# Patient Record
Sex: Female | Born: 1980 | Race: White | Hispanic: No | Marital: Married | Smoking: Current some day smoker
Health system: Southern US, Community
[De-identification: ages and names within clinical notes are randomized; demographics above are authoritative.]

## PROBLEM LIST (undated history)

## (undated) DIAGNOSIS — M1990 Unspecified osteoarthritis, unspecified site: Secondary | ICD-10-CM

## (undated) DIAGNOSIS — J45909 Unspecified asthma, uncomplicated: Secondary | ICD-10-CM

## (undated) HISTORY — PX: APPENDECTOMY: SHX54

## (undated) HISTORY — PX: KNEE ARTHROSCOPY: SUR90

---

## 2004-06-15 ENCOUNTER — Emergency Department: Payer: Self-pay | Admitting: Emergency Medicine

## 2004-09-17 ENCOUNTER — Emergency Department: Payer: Self-pay | Admitting: Emergency Medicine

## 2005-01-22 ENCOUNTER — Emergency Department: Payer: Self-pay | Admitting: Emergency Medicine

## 2005-03-17 ENCOUNTER — Emergency Department: Payer: Self-pay | Admitting: Emergency Medicine

## 2005-06-10 ENCOUNTER — Emergency Department: Payer: Self-pay | Admitting: Emergency Medicine

## 2007-09-02 ENCOUNTER — Observation Stay: Payer: Self-pay | Admitting: Unknown Physician Specialty

## 2008-01-12 ENCOUNTER — Observation Stay: Payer: Self-pay | Admitting: Unknown Physician Specialty

## 2008-02-12 ENCOUNTER — Observation Stay: Payer: Self-pay

## 2008-03-06 ENCOUNTER — Observation Stay: Payer: Self-pay | Admitting: Unknown Physician Specialty

## 2008-03-16 ENCOUNTER — Ambulatory Visit: Payer: Self-pay

## 2008-03-19 ENCOUNTER — Ambulatory Visit: Payer: Self-pay

## 2008-03-23 ENCOUNTER — Observation Stay: Payer: Self-pay

## 2008-03-28 ENCOUNTER — Ambulatory Visit: Payer: Self-pay

## 2008-03-29 ENCOUNTER — Inpatient Hospital Stay: Payer: Self-pay

## 2008-10-29 ENCOUNTER — Ambulatory Visit: Payer: Self-pay | Admitting: Family Medicine

## 2008-11-14 ENCOUNTER — Emergency Department: Payer: Self-pay | Admitting: Emergency Medicine

## 2008-12-11 ENCOUNTER — Ambulatory Visit: Payer: Self-pay | Admitting: Unknown Physician Specialty

## 2009-02-06 ENCOUNTER — Ambulatory Visit: Payer: Self-pay | Admitting: Family Medicine

## 2009-04-19 ENCOUNTER — Ambulatory Visit: Payer: Self-pay | Admitting: Family Medicine

## 2009-05-14 ENCOUNTER — Emergency Department: Payer: Self-pay | Admitting: Emergency Medicine

## 2009-09-05 ENCOUNTER — Ambulatory Visit: Payer: Self-pay

## 2009-09-18 ENCOUNTER — Ambulatory Visit: Payer: Self-pay | Admitting: Orthopedic Surgery

## 2009-09-19 ENCOUNTER — Ambulatory Visit: Payer: Self-pay | Admitting: Orthopedic Surgery

## 2014-04-27 ENCOUNTER — Ambulatory Visit: Payer: Self-pay | Admitting: Family Medicine

## 2014-05-27 ENCOUNTER — Observation Stay: Payer: Self-pay | Admitting: Surgery

## 2014-05-27 LAB — URINALYSIS, COMPLETE
BACTERIA: NONE SEEN
Bilirubin,UR: NEGATIVE
Blood: NEGATIVE
Glucose,UR: NEGATIVE mg/dL (ref 0–75)
Ketone: NEGATIVE
Leukocyte Esterase: NEGATIVE
Nitrite: NEGATIVE
Ph: 5 (ref 4.5–8.0)
Protein: NEGATIVE
RBC,UR: 1 /HPF (ref 0–5)
Specific Gravity: 1.011 (ref 1.003–1.030)
Squamous Epithelial: 12
WBC UR: 1 /HPF (ref 0–5)

## 2014-05-27 LAB — CBC WITH DIFFERENTIAL/PLATELET
BASOS PCT: 0.3 %
Basophil #: 0 10*3/uL (ref 0.0–0.1)
EOS ABS: 0 10*3/uL (ref 0.0–0.7)
EOS PCT: 0 %
HCT: 40.3 % (ref 35.0–47.0)
HGB: 13 g/dL (ref 12.0–16.0)
LYMPHS PCT: 10.3 %
Lymphocyte #: 1 10*3/uL (ref 1.0–3.6)
MCH: 30 pg (ref 26.0–34.0)
MCHC: 32.2 g/dL (ref 32.0–36.0)
MCV: 93 fL (ref 80–100)
Monocyte #: 0.4 x10 3/mm (ref 0.2–0.9)
Monocyte %: 4.4 %
NEUTROS ABS: 8.2 10*3/uL — AB (ref 1.4–6.5)
Neutrophil %: 85 %
Platelet: 219 10*3/uL (ref 150–440)
RBC: 4.32 10*6/uL (ref 3.80–5.20)
RDW: 13.8 % (ref 11.5–14.5)
WBC: 9.7 10*3/uL (ref 3.6–11.0)

## 2014-05-27 LAB — COMPREHENSIVE METABOLIC PANEL
ALK PHOS: 94 U/L
ALT: 29 U/L
ANION GAP: 5 — AB (ref 7–16)
Albumin: 3.8 g/dL (ref 3.4–5.0)
BUN: 11 mg/dL (ref 7–18)
Bilirubin,Total: 0.9 mg/dL (ref 0.2–1.0)
CO2: 25 mmol/L (ref 21–32)
Calcium, Total: 8.5 mg/dL (ref 8.5–10.1)
Chloride: 109 mmol/L — ABNORMAL HIGH (ref 98–107)
Creatinine: 0.83 mg/dL (ref 0.60–1.30)
Glucose: 121 mg/dL — ABNORMAL HIGH (ref 65–99)
OSMOLALITY: 278 (ref 275–301)
Potassium: 4.3 mmol/L (ref 3.5–5.1)
SGOT(AST): 14 U/L — ABNORMAL LOW (ref 15–37)
SODIUM: 139 mmol/L (ref 136–145)
Total Protein: 7.1 g/dL (ref 6.4–8.2)

## 2014-05-27 LAB — LIPASE, BLOOD: Lipase: 78 U/L (ref 73–393)

## 2014-10-04 ENCOUNTER — Ambulatory Visit
Admit: 2014-10-04 | Disposition: A | Discharge status: Home / Self Care | Payer: Self-pay | Attending: Family Medicine | Admitting: Family Medicine

## 2014-10-27 NOTE — H&P (Signed)
Subjective/Chief Complaint abd pain   History of Present Illness started diffuse, now max in RLQ nausea, no emesis, started last nigth worsening no prior episode no f/c. nml BM   Past History RA PSH c section x 2   Past Medical Health Smoking   Past Med/Surgical Hx:  Asthma:   JAW SURGERY TO CORRECT OVERBITE:   C-Section:   ALLERGIES:  Tylenol w/Codeine: Itching  Vicodin: Itching  Family and Social History:  Family History Non-Contributory   Social History positive tobacco (Greater than 1 year), negative ETOH, peds office/front   + Tobacco Current (within 1 year)   Review of Systems:  Fever/Chills No   Cough No   Abdominal Pain Yes   Diarrhea No   Constipation No   Nausea/Vomiting Yes   SOB/DOE No   Dysuria No   Tolerating Diet No  Nauseated   Medications/Allergies Reviewed Medications/Allergies reviewed   Physical Exam:  GEN obese, uncomfortable   HEENT pink conjunctivae   NECK supple   RESP normal resp effort  clear BS   CARD regular rate   ABD positive tenderness  normal BS  pos Rovsing's sign, guarding RLQ   LYMPH negative neck   EXTR negative edema   SKIN No rashes, tattoos   PSYCH alert, A+O to time, place, person   Lab Results: Hepatic:  22-Nov-15 10:00   Bilirubin, Total 0.9  Alkaline Phosphatase 94 (46-116 NOTE: New Reference Range 01/23/14)  SGPT (ALT) 29 (14-63 NOTE: New Reference Range 01/23/14)  SGOT (AST)  14  Total Protein, Serum 7.1  Albumin, Serum 3.8  Routine Chem:  22-Nov-15 10:00   Lipase 78 (Result(s) reported on 27 May 2014 at 10:53AM.)  Glucose, Serum  121  BUN 11  Creatinine (comp) 0.83  Sodium, Serum 139  Potassium, Serum 4.3  Chloride, Serum  109  CO2, Serum 25  Calcium (Total), Serum 8.5  Osmolality (calc) 278  eGFR (African American) >60  eGFR (Non-African American) >60 (eGFR values <67m/min/1.73 m2 may be an indication of chronic kidney disease (CKD). Calculated eGFR, using the MRDR  Study equation, is useful in  patients with stable renal function. The eGFR calculation will not be reliable in acutely ill patients when serum creatinine is changing rapidly. It is not useful in patients on dialysis. The eGFR calculation may not be applicable to patients at the low and high extremes of body sizes, pregnant women, and vegetarians.)  Anion Gap  5  Routine UA:  22-Nov-15 10:00   Color (UA) Yellow  Clarity (UA) Hazy  Glucose (UA) Negative  Bilirubin (UA) Negative  Ketones (UA) Negative  Specific Gravity (UA) 1.011  Blood (UA) Negative  pH (UA) 5.0  Protein (UA) Negative  Nitrite (UA) Negative  Leukocyte Esterase (UA) Negative (Result(s) reported on 27 May 2014 at 10:46AM.)  RBC (UA) 1 /HPF  WBC (UA) 1 /HPF  Bacteria (UA) NONE SEEN  Epithelial Cells (UA) 12 /HPF  Mucous (UA) PRESENT (Result(s) reported on 27 May 2014 at 10:46AM.)  Routine Hem:  22-Nov-15 10:00   WBC (CBC) 9.7  RBC (CBC) 4.32  Hemoglobin (CBC) 13.0  Hematocrit (CBC) 40.3  Platelet Count (CBC) 219  MCV 93  MCH 30.0  MCHC 32.2  RDW 13.8  Neutrophil % 85.0  Lymphocyte % 10.3  Monocyte % 4.4  Eosinophil % 0.0  Basophil % 0.3  Neutrophil #  8.2  Lymphocyte # 1.0  Monocyte # 0.4  Eosinophil # 0.0  Basophil # 0.0 (Result(s) reported on 27 May 2014 at 10:43AM.)   Radiology Results: [place]:    22-Nov-15 11:22, US Pelvis Ultrasound Exam with Transvaginal - NON-OB  US Pelvis Ultrasound Exam with Transvaginal - NON-OB  REASON FOR EXAM:    rlq pain  COMMENTS:       PROCEDURE: [place]  - US PELVIS EXAM W/TRANSVAGINAL  - May 27 2014 11:22AM     CLINICAL DATA:  34 year old female with right pelvic pain for 12 hr.    EXAM:  TRANSABDOMINAL AND TRANSVAGINAL ULTRASOUND OF PELVIS    TECHNIQUE:  Both transabdominal and transvaginal ultrasound examinations of the  pelvis were performed. Transabdominal technique was performed for  global imaging of the pelvis including uterus, ovaries, adnexal  regions,  and pelvic cul-de-sac. It was necessary to proceed with  endovaginal exam following the transabdominal exam to visualize the  ovaries and endometrium.    COMPARISON:  None    FINDINGS:  Uterus    Measurements: Anteverted measuring 8.5 x 3.8 x 5.3 cm. No fibroids  or other mass visualized.    Endometrium    Thickness: Homogeneous measuring 6.8 mm. No focal abnormality  visualized.  Right ovary    Measurements: 2.9 x 2.7 x 3.4 cm. A 1.5 x 2 cm cyst/ follicle is  noted.. Normal appearance/no adnexal mass.    Left ovary    Measurements: 2.6 x 1.2 x 2.6 cm.. Normal appearance/no adnexal  mass.    Other findings    A trace amount of free fluid may be physiologic. There is no  evidence of adnexal mass.   IMPRESSION:  Trace amount of free pelvic fluid which may be physiologic.  Otherwise unremarkable pelvic ultrasound.      Electronically Signed    By: Hassan Rowan M.D.    On: 05/27/2014 11:54         Verified By: Lura Em, M.D.,  CT:    22-Nov-15 11:58, CT Abdomen and Pelvis With Contrast  CT Abdomen and Pelvis With Contrast  REASON FOR EXAM:    (1) rlq and ruq pain; (2) rlq and ruq pain  COMMENTS:       PROCEDURE: CT  - CT ABDOMEN / PELVIS  W  - May 27 2014 11:58AM     CLINICAL DATA:  Generalized abdominal pain beginning last night.  Worse today. Nausea.    EXAM:  CT ABDOMEN AND PELVIS WITH CONTRAST    TECHNIQUE:  Multidetector CT imaging of the abdomen and pelvis was performed  using the standard protocol following bolus administration of  intravenous contrast.  CONTRAST:  125 cc Isovue 370.    COMPARISON:  Pelvic ultrasound 05/27/2014.    FINDINGS:  Lung bases are clear.  No effusions.  Heart is normal size.    Multiple gallstones noted within the gallbladder. No biliary ductal  dilatation. No visible ductal stones. Liver, spleen, pancreas,  adrenals and kidneys are normal.    Stomach, large and small bowel are unremarkable. Trace free fluid  in  the pelvis, likely physiologic. Uterus and adnexa unremarkable.  Small collapsing corpus luteum cyst in the right ovary. Aorta is  normal caliber.  There is an appendicolith with in the mid portion of the appendix,  best seen on coronal image 72. The distal appendix beyond the  appendicolith is dilated with slight inflammatory change compatible  with mid to distal appendicitis.    No acute bony abnormality or focal bone lesion. Bilateral L5 pars  defects present. No malalignment.     IMPRESSION:  Appendicolith  in the mid appendix with dilated appendix distally  compatible with mid to distal appendicitis.      Electronically Signed    By: Rolm Baptise M.D.  On: 05/27/2014 12:17         Verified By: Raelyn Number, M.D.,    Assessment/Admission Diagnosis ac appendicitis rec lap appy risks options agrees   Electronic Signatures: Florene Glen (MD)  (Signed [phone] 13:24)  Authored: CHIEF COMPLAINT and HISTORY, PAST MEDICAL/SURGIAL HISTORY, ALLERGIES, FAMILY AND SOCIAL HISTORY, REVIEW OF SYSTEMS, PHYSICAL EXAM, LABS, Radiology, ASSESSMENT AND PLAN   Last Updated: 22-Nov-15 13:24 by Florene Glen (MD)

## 2014-10-27 NOTE — H&P (Signed)
PATIENT NAME:  Allison Moss, Briar S MR#:  [id number] DATE OF BIRTH:  September 22, 1980  DATE OF ADMISSION:  05/27/2014  CHIEF COMPLAINT: Abdominal pain.   HISTORY OF PRESENT ILLNESS: This is a patient with right lower quadrant pain that started periumbilical and somewhat diffusely and is now centered in the right lower quadrant. It has been worsening. It started last night. She has never had an episode like this before. Denies melena, hematochezia. She had a normal bowel movement without diarrhea today. No fevers or chills. She is nauseated, but has not vomited. A work up in the emergency room suggested acute appendicitis and I was asked to see the patient for this.   PAST MEDICAL HISTORY: Rheumatoid arthritis.   PAST SURGICAL HISTORY: C-section x 2.   ALLERGIES: None.   FAMILY HISTORY: Noncontributory.   SOCIAL HISTORY: The patient smokes tobacco. Does not drink alcohol. 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   REVIEW OF SYSTEMS: A 10 system review is performed and negative with the exception of that mentioned in the HPI.   PHYSICAL EXAMINATION: GENERAL: Uncomfortable -appearing, obese, female patient. BMI is 38.   VITAL SIGNS: Temperature of 98, pulse of 89, respirations 18, blood pressure 156/72. Pain scale at 9 and 96% room air saturation.   HEENT: No scleral icterus.   NECK: No palpable neck nodes.   CHEST: Clear to auscultation.   CARDIAC: Regular rate and rhythm.   ABDOMEN: Showing some guarding in the right lower quadrant with maximum tenderness at McBurney's point with a positive Rovsing's sign.   EXTREMITIES: Without edema.   NEUROLOGIC: Grossly intact.   INTEGUMENT: No jaundice. Tattoos are noted.   LABORATORY DATA: White blood cell count is normal at 9.7, hemoglobin and hematocrit of 13 and 40 with a platelet count of 219,000. Electrolytes are within normal limits.   DIAGNOSTIC DATA: CT scan is personally reviewed,  suggestive of early appendicitis.   ASSESSMENT  AND PLAN: This is a patient with acute appendicitis. Preoperatively, we discussed the rationale for surgery, the options of observation, risk of bleeding, infection, recurrence, negative laparoscopy and conversion to an open procedure. This was reviewed for her and her family member. They understood and agreed to proceed.     ____________________________ Adah Salvageichard E. Excell Seltzerooper, MD rec:TT D: 05/27/2014 13:27:55 ET T: 05/27/2014 14:24:27 ET JOB#: [id number]  cc: Adah Salvageichard E. Excell Seltzerooper, MD, <Dictator> Lattie HawICHARD E Kallin Henk MD ELECTRONICALLY SIGNED 05/27/2014 16:10

## 2014-10-27 NOTE — Op Note (Signed)
PATIENT NAME:  Allison Moss, Jrue S MR#:  [id number] DATE OF BIRTH:  March 08, 1981  DATE OF PROCEDURE:  05/27/2014  PREOPERATIVE DIAGNOSIS: Acute appendicitis.   POSTOPERATIVE DIAGNOSIS: Acute appendicitis.   PROCEDURE: Laparoscopic appendectomy.   SURGEON: Jayro Mcmath E. Excell Seltzerooper, MD   ANESTHESIA: General with endotracheal tube.   INDICATIONS: This is a patient with progressive right lower quadrant pain and tenderness with signs of peritoneal inflammation; a CT scan suggestive of acute appendicitis. Preoperatively, we discussed rationale for surgery, the options of observation, risks of bleeding, infection, recurrence, negative laparoscopy, and conversion to an open procedure. This was all reviewed for sure her and her family in the Emergency Room. They understood and agreed to proceed.   FINDINGS: Acute appendicitis, nonruptured.   DESCRIPTION OF PROCEDURE: The patient was induced to general anesthesia. She was given IV antibiotics. VTE prophylaxis was in place. She was prepped and draped in a sterile fashion. A Foley catheter had been placed as well. Local anesthetic was infiltrated in the skin and subcutaneous tissues around the infraumbilical area. An incision was made. Veress needle was placed. Pneumoperitoneum was obtained. A 5 mm trocar port was placed. The abdominal cavity was explored and under direct vision, a 13 mm left lateral port was placed followed by a 5 mm suprapubic port. There were no adhesions from her prior C-sections noted.  The appendix was identified in the right lower quadrant and elevated. The base of the appendix was divided with a standard load Endo GIA, and then a vascular load Endo GIA was fired across the mesoappendix. The specimen was passed out through the lateral port site with the aid of an Endo Catch bag. The area was checked for hemostasis. The staple line had an arterial bleeder on it, which was handled with clips adequately controlling the hemorrhage, which was minimal.  Again, the area was irrigated with copious amounts of normal saline. No further bleeding was noted. The left lateral port site was closed under direct vision with multiple simple sutures of 0 Vicryl utilizing an Endo Close technique, then again hemostasis found to be adequate. Pneumoperitoneum was released. All ports were removed. A 4-0 subcuticular Monocryl was used on all skin edges. Steri-Strips, Mastisol, and sterile dressings were placed.  The patient tolerated the procedure well. There were no complications. She was taken to the recovery room in stable condition to be admitted for continued care.    ____________________________ Adah Salvageichard E. Excell Seltzerooper, MD rec:sw D: 05/27/2014 15:29:30 ET T: 05/27/2014 18:50:04 ET JOB#: [id number]  cc: Adah Salvageichard E. Excell Seltzerooper, MD, <Dictator> Lattie HawICHARD E Johnjoseph Rolfe MD ELECTRONICALLY SIGNED 06/04/2014 19:46

## 2014-10-29 LAB — SURGICAL PATHOLOGY

## 2015-01-09 ENCOUNTER — Encounter: Payer: Self-pay | Admitting: Family Medicine

## 2015-01-09 ENCOUNTER — Telehealth: Payer: Self-pay

## 2015-01-09 ENCOUNTER — Other Ambulatory Visit
Admission: RE | Admit: 2015-01-09 | Discharge: 2015-01-09 | Disposition: A | Discharge status: Home / Self Care | Payer: No Typology Code available for payment source | Source: Ambulatory Visit | Attending: Family Medicine | Admitting: Family Medicine

## 2015-01-09 ENCOUNTER — Ambulatory Visit (INDEPENDENT_AMBULATORY_CARE_PROVIDER_SITE_OTHER): Payer: No Typology Code available for payment source | Admitting: Family Medicine

## 2015-01-09 VITALS — BP 110/80 | HR 78 | Temp 98.2°F | Resp 16 | Ht 69.0 in | Wt 261.6 lb

## 2015-01-09 DIAGNOSIS — R51 Headache: Secondary | ICD-10-CM | POA: Diagnosis not present

## 2015-01-09 DIAGNOSIS — R600 Localized edema: Secondary | ICD-10-CM | POA: Insufficient documentation

## 2015-01-09 DIAGNOSIS — Z8669 Personal history of other diseases of the nervous system and sense organs: Secondary | ICD-10-CM | POA: Insufficient documentation

## 2015-01-09 DIAGNOSIS — Z8659 Personal history of other mental and behavioral disorders: Secondary | ICD-10-CM | POA: Insufficient documentation

## 2015-01-09 DIAGNOSIS — G43019 Migraine without aura, intractable, without status migrainosus: Secondary | ICD-10-CM | POA: Insufficient documentation

## 2015-01-09 DIAGNOSIS — L409 Psoriasis, unspecified: Secondary | ICD-10-CM | POA: Insufficient documentation

## 2015-01-09 DIAGNOSIS — L4050 Arthropathic psoriasis, unspecified: Secondary | ICD-10-CM | POA: Insufficient documentation

## 2015-01-09 DIAGNOSIS — Z8709 Personal history of other diseases of the respiratory system: Secondary | ICD-10-CM | POA: Insufficient documentation

## 2015-01-09 DIAGNOSIS — R519 Headache, unspecified: Secondary | ICD-10-CM | POA: Insufficient documentation

## 2015-01-09 DIAGNOSIS — M7060 Trochanteric bursitis, unspecified hip: Secondary | ICD-10-CM | POA: Insufficient documentation

## 2015-01-09 DIAGNOSIS — Z8619 Personal history of other infectious and parasitic diseases: Secondary | ICD-10-CM | POA: Insufficient documentation

## 2015-01-09 DIAGNOSIS — Z9189 Other specified personal risk factors, not elsewhere classified: Secondary | ICD-10-CM | POA: Insufficient documentation

## 2015-01-09 DIAGNOSIS — A6923 Arthritis due to Lyme disease: Secondary | ICD-10-CM | POA: Insufficient documentation

## 2015-01-09 LAB — CBC WITH DIFFERENTIAL/PLATELET
Basophils Absolute: 0.2 10*3/uL — ABNORMAL HIGH (ref 0–0.1)
Basophils Relative: 3 %
EOS ABS: 0 10*3/uL (ref 0–0.7)
Eosinophils Relative: 0 %
HCT: 39.6 % (ref 35.0–47.0)
Hemoglobin: 13.1 g/dL (ref 12.0–16.0)
Lymphocytes Relative: 14 %
Lymphs Abs: 1.3 10*3/uL (ref 1.0–3.6)
MCH: 30.6 pg (ref 26.0–34.0)
MCHC: 33.1 g/dL (ref 32.0–36.0)
MCV: 92.5 fL (ref 80.0–100.0)
Monocytes Absolute: 0.4 10*3/uL (ref 0.2–0.9)
Monocytes Relative: 4 %
NEUTROS PCT: 79 %
Neutro Abs: 7.8 10*3/uL — ABNORMAL HIGH (ref 1.4–6.5)
Platelets: 245 10*3/uL (ref 150–440)
RBC: 4.28 MIL/uL (ref 3.80–5.20)
RDW: 14.1 % (ref 11.5–14.5)
WBC: 9.7 10*3/uL (ref 3.6–11.0)

## 2015-01-09 LAB — RENAL FUNCTION PANEL
ANION GAP: 10 (ref 5–15)
Albumin: 4.1 g/dL (ref 3.5–5.0)
BUN: 10 mg/dL (ref 6–20)
CHLORIDE: 105 mmol/L (ref 101–111)
CO2: 24 mmol/L (ref 22–32)
Calcium: 9.2 mg/dL (ref 8.9–10.3)
Creatinine, Ser: 0.67 mg/dL (ref 0.44–1.00)
Glucose, Bld: 98 mg/dL (ref 65–99)
POTASSIUM: 4.5 mmol/L (ref 3.5–5.1)
Phosphorus: 2.5 mg/dL (ref 2.5–4.6)
Sodium: 139 mmol/L (ref 135–145)

## 2015-01-09 MED ORDER — KETOROLAC TROMETHAMINE 60 MG/2ML IM SOLN
60.0000 mg | Freq: Once | INTRAMUSCULAR | Status: AC
  Administered 2015-01-09: 60 mg via INTRAMUSCULAR

## 2015-01-09 MED ORDER — FUROSEMIDE 20 MG PO TABS: 20.0000 mg | ORAL_TABLET | Freq: Every day | ORAL | Status: DC

## 2015-01-09 NOTE — Telephone Encounter (Signed)
-----   Message from Anola Gurneyobert Chauvin, [place] sent at 01/09/2015 12:58 PM EDT ----- Labs are ok-no anemia or white count elevation. Kidneys are fine.

## 2015-01-09 NOTE — Patient Instructions (Signed)
We will call you about your lab results when received.

## 2015-01-09 NOTE — Progress Notes (Signed)
Subjective:     Patient ID: Allison Moss, female   DOB: 04/18/1981, 34 y.o.   MRN: [id number]  HPI  Chief Complaint  Patient presents with  . Foot Swelling    Patient comes in office today with complaints of swelling in her lower extermities and migraine. Monday patient reports that her5 feet began to swell and had tightness in hoth feet and ankles that has persisted. Yesterday patient reports that she had a migraine thats has lasted until today, associated symptoms include blurred bision, eye redness and weakness.   States she has developed a throbbing headache this AM which is unlike her usual migraine headaches. States she is currently on her menses which has been ongoing( light) for two weeks. States this is not atypical for her (hx of tubal ligation). Was at the beach over the holiday and returned on 7/4. Attributes her ankle swelling to the drive. Reports they were staying in the shade of [place] while on the beach.   Review of Systems  Neurological:       Felt like she was going to pass out at one point which prompted her office visit today.       Objective:   Physical Exam  Constitutional: She appears well-developed and well-nourished. She appears distressed.  Eyes: EOM are normal. Pupils are equal, round, and reactive to light.  Cardiovascular: Normal rate and regular rhythm.   Pulmonary/Chest: Breath sounds normal.       Assessment:    1. Acute nonintractable headache, unspecified headache type - CBC with Differential/Platelet - ketorolac (TORADOL) injection 60 mg; Inject 2 mLs (60 mg total) into the muscle once.  2. Pedal edema - Renal function panel - furosemide (LASIX) 20 MG tablet; Take 1 tablet (20 mg total) by mouth daily. As needed for leg swelling  Dispense: 7 tablet; Refill: 0    Plan:    Further f/u pending lab work.

## 2015-01-09 NOTE — Telephone Encounter (Signed)
Patient has been advised

## 2015-01-10 ENCOUNTER — Telehealth: Payer: Self-pay | Admitting: Family Medicine

## 2015-01-10 NOTE — Telephone Encounter (Signed)
Pt states she is having new symptoms.  Pt is states last night and this morning she felt like she was going to pass out, she was dizzy, felt hot, vision was blurry and hands and arms were tingling.  Pt states this lasted about 15 minutes each time.  Pt is request a call back.  CB#[phone]/MJ

## 2015-01-10 NOTE — Telephone Encounter (Signed)
Her associated symptoms were they due to migraine?

## 2015-01-10 NOTE — Telephone Encounter (Signed)
Discussed office visit tomorrow if not improved.Normal labs were reviewed and discussed with the patient

## 2015-01-11 ENCOUNTER — Encounter: Payer: Self-pay | Admitting: Family Medicine

## 2015-01-11 ENCOUNTER — Ambulatory Visit (INDEPENDENT_AMBULATORY_CARE_PROVIDER_SITE_OTHER): Payer: No Typology Code available for payment source | Admitting: Family Medicine

## 2015-01-11 VITALS — BP 108/82 | HR 76 | Temp 97.9°F | Resp 16 | Wt 255.2 lb

## 2015-01-11 DIAGNOSIS — R42 Dizziness and giddiness: Secondary | ICD-10-CM

## 2015-01-11 DIAGNOSIS — G4452 New daily persistent headache (NDPH): Secondary | ICD-10-CM

## 2015-01-11 MED ORDER — PREDNISONE 20 MG PO TABS: ORAL_TABLET | ORAL | Status: DC

## 2015-01-11 NOTE — Progress Notes (Signed)
Subjective:     Patient ID: Allison Moss, female   DOB: 01/16/1981, 34 y.o.   MRN: [id number]  HPI  Chief Complaint  Patient presents with  . Nausea    Patient is present in office today for 2 day follow up, she states her symptomsd of nausea and dizziness have become more frequent and intense with episodes of  headache.   She has been taking Tylenol for her headache which moderates it but does not make it go away. States pulsating headache involves her entire head which is unlike previous migraines. Dizziness is described as off-balance, not vertigo. CBC and renal profile on 7/6 were normal.   Review of Systems  Constitutional: Negative for fever and chills.  Cardiovascular: Negative for palpitations.       Objective:   Physical Exam  Constitutional: She appears well-developed and well-nourished. She appears distressed (mild-lying on exam table  on presentation.).  Eyes: EOM are normal. Pupils are equal, round, and reactive to light.  Cardiovascular: Normal rate and regular rhythm.   Pulmonary/Chest: Breath sounds normal.  Neurological: Coordination (Romberg negative) abnormal.       Assessment:    1. New daily persistent headache-Briefly discussed with Dr. Sherrie MustacheFisher. - MR Brain Wo Contrast; Future - predniSONE (DELTASONE) 20 MG tablet; Taper as follows: 3 pills for 7 days, then 2 pills for 3 days , then one pill for two days  Dispense: 29 tablet; Refill: 0  2. Dizziness - MR Brain Wo Contrast; Future - predniSONE (DELTASONE) 20 MG tablet; Taper as follows: 3 pills for 7 days, then 2 pills for 3 days , then one pill for two days  Dispense: 29 tablet; Refill: 0    Plan:    Work excuse for 7/6-7/10

## 2015-01-11 NOTE — Patient Instructions (Signed)
We will call you with MRI report.

## 2015-01-14 ENCOUNTER — Emergency Department
Admission: EM | Admit: 2015-01-14 | Discharge: 2015-01-14 | Disposition: A | Discharge status: Home / Self Care | Payer: No Typology Code available for payment source | Attending: Emergency Medicine | Admitting: Emergency Medicine

## 2015-01-14 ENCOUNTER — Emergency Department: Payer: No Typology Code available for payment source

## 2015-01-14 ENCOUNTER — Encounter: Payer: Self-pay | Admitting: Emergency Medicine

## 2015-01-14 DIAGNOSIS — Z3202 Encounter for pregnancy test, result negative: Secondary | ICD-10-CM | POA: Diagnosis not present

## 2015-01-14 DIAGNOSIS — Z720 Tobacco use: Secondary | ICD-10-CM | POA: Insufficient documentation

## 2015-01-14 DIAGNOSIS — R51 Headache: Secondary | ICD-10-CM | POA: Diagnosis not present

## 2015-01-14 DIAGNOSIS — R202 Paresthesia of skin: Secondary | ICD-10-CM | POA: Insufficient documentation

## 2015-01-14 DIAGNOSIS — R519 Headache, unspecified: Secondary | ICD-10-CM

## 2015-01-14 DIAGNOSIS — R200 Anesthesia of skin: Secondary | ICD-10-CM | POA: Diagnosis not present

## 2015-01-14 DIAGNOSIS — R42 Dizziness and giddiness: Secondary | ICD-10-CM | POA: Diagnosis not present

## 2015-01-14 HISTORY — DX: Unspecified osteoarthritis, unspecified site: M19.90

## 2015-01-14 LAB — COMPREHENSIVE METABOLIC PANEL
ALT: 20 U/L (ref 14–54)
ANION GAP: 6 (ref 5–15)
AST: 19 U/L (ref 15–41)
Albumin: 4.5 g/dL (ref 3.5–5.0)
Alkaline Phosphatase: 78 U/L (ref 38–126)
BUN: 15 mg/dL (ref 6–20)
CHLORIDE: 105 mmol/L (ref 101–111)
CO2: 26 mmol/L (ref 22–32)
Calcium: 9.2 mg/dL (ref 8.9–10.3)
Creatinine, Ser: 0.75 mg/dL (ref 0.44–1.00)
GFR calc Af Amer: >60 mL/min (ref >60)
Glucose, Bld: 99 mg/dL (ref 65–99)
POTASSIUM: 4 mmol/L (ref 3.5–5.1)
Sodium: 137 mmol/L (ref 135–145)
TOTAL PROTEIN: 7.5 g/dL (ref 6.5–8.1)
Total Bilirubin: 1.2 mg/dL (ref 0.3–1.2)

## 2015-01-14 LAB — CBC WITH DIFFERENTIAL/PLATELET
BASOS PCT: 0 %
Basophils Absolute: 0 10*3/uL (ref 0–0.1)
EOS PCT: 0 %
Eosinophils Absolute: 0 10*3/uL (ref 0–0.7)
HCT: 39.7 % (ref 35.0–47.0)
Hemoglobin: 13.1 g/dL (ref 12.0–16.0)
LYMPHS PCT: 20 %
Lymphs Abs: 1.7 10*3/uL (ref 1.0–3.6)
MCH: 30.4 pg (ref 26.0–34.0)
MCHC: 32.9 g/dL (ref 32.0–36.0)
MCV: 92.5 fL (ref 80.0–100.0)
Monocytes Absolute: 0.4 10*3/uL (ref 0.2–0.9)
Monocytes Relative: 5 %
Neutro Abs: 6.4 10*3/uL (ref 1.4–6.5)
Neutrophils Relative %: 75 %
Platelets: 241 10*3/uL (ref 150–440)
RBC: 4.29 MIL/uL (ref 3.80–5.20)
RDW: 13.8 % (ref 11.5–14.5)
WBC: 8.6 10*3/uL (ref 3.6–11.0)

## 2015-01-14 LAB — URINALYSIS COMPLETE WITH MICROSCOPIC (ARMC ONLY)
Bilirubin Urine: NEGATIVE
Glucose, UA: NEGATIVE mg/dL
Ketones, ur: NEGATIVE mg/dL
Leukocytes, UA: NEGATIVE
NITRITE: NEGATIVE
PH: 5 (ref 5.0–8.0)
PROTEIN: NEGATIVE mg/dL
SPECIFIC GRAVITY, URINE: 1.024 (ref 1.005–1.030)

## 2015-01-14 LAB — TROPONIN I: Troponin I: <0.03 ng/mL (ref <0.031)

## 2015-01-14 LAB — POCT PREGNANCY, URINE: Preg Test, Ur: NEGATIVE

## 2015-01-14 MED ORDER — METOCLOPRAMIDE HCL 5 MG/ML IJ SOLN
INTRAMUSCULAR | Status: AC
  Administered 2015-01-14: 10 mg via INTRAVENOUS
  Filled 2015-01-14: qty 2

## 2015-01-14 MED ORDER — DIPHENHYDRAMINE HCL 50 MG/ML IJ SOLN
INTRAMUSCULAR | Status: AC
  Administered 2015-01-14: 25 mg via INTRAVENOUS
  Filled 2015-01-14: qty 1

## 2015-01-14 MED ORDER — DIAZEPAM 5 MG PO TABS: 5.0000 mg | ORAL_TABLET | Freq: Three times a day (TID) | ORAL | Status: DC | PRN

## 2015-01-14 MED ORDER — SODIUM CHLORIDE 0.9 % IV SOLN
Freq: Once | INTRAVENOUS | Status: AC
  Administered 2015-01-14: 1000 mL via INTRAVENOUS

## 2015-01-14 MED ORDER — KETOROLAC TROMETHAMINE 30 MG/ML IJ SOLN
INTRAMUSCULAR | Status: AC
  Administered 2015-01-14: 30 mg via INTRAVENOUS
  Filled 2015-01-14: qty 1

## 2015-01-14 MED ORDER — KETOROLAC TROMETHAMINE 30 MG/ML IJ SOLN
30.0000 mg | Freq: Once | INTRAMUSCULAR | Status: AC
  Administered 2015-01-14: 30 mg via INTRAVENOUS

## 2015-01-14 MED ORDER — DIPHENHYDRAMINE HCL 50 MG/ML IJ SOLN
25.0000 mg | Freq: Once | INTRAMUSCULAR | Status: AC
Start: 2015-01-14 — End: 2015-01-14
  Administered 2015-01-14: 25 mg via INTRAVENOUS

## 2015-01-14 MED ORDER — METOCLOPRAMIDE HCL 5 MG/ML IJ SOLN
10.0000 mg | Freq: Once | INTRAMUSCULAR | Status: AC
Start: 2015-01-14 — End: 2015-01-14
  Administered 2015-01-14: 10 mg via INTRAVENOUS

## 2015-01-14 NOTE — ED Notes (Addendum)
Patient states she was seen by PCP for headache and numbness to lips, as well as tingling to arms intermittently x2 weeks. Has MRI scheduled for Wednesday. States she "can't feel like this until Wednesday".

## 2015-01-14 NOTE — Discharge Instructions (Signed)
Dizziness  Dizziness means you feel unsteady or lightheaded. You might feel like you are going to pass out (faint). HOME CARE   Drink enough fluids to keep your pee (urine) clear or pale yellow.  Take your medicines exactly as told by your doctor. If you take blood pressure medicine, always stand up slowly from the lying or sitting position. Hold on to something to steady yourself.  If you need to stand in one place for a long time, move your legs often. Tighten and relax your leg muscles.  Have someone stay with you until you feel okay.  Do not drive or use heavy machinery if you feel dizzy.  Do not drink alcohol. GET HELP RIGHT AWAY IF:   You feel dizzy or lightheaded and it gets worse.  You feel sick to your stomach (nauseous), or you throw up (vomit).  You have trouble talking or walking.  You feel weak or have trouble using your arms, hands, or legs.  You cannot think clearly or have trouble forming sentences.  You have chest pain, belly (abdominal) pain, sweating, or you are short of breath.  Your vision changes.  You are bleeding.  You have problems from your medicine that seem to be getting worse. MAKE SURE YOU:   Understand these instructions.  Will watch your condition.  Will get help right away if you are not doing well or get worse. Document Released: 06/11/2011 Document Revised: 09/14/2011 Document Reviewed: 06/11/2011 [facility] Patient Information 2015 [place], [place]. This information is not intended to replace advice given to you by your health care provider. Make sure you discuss any questions you have with your health care provider.  General Headache Without Cause A general headache is pain or discomfort felt around the head or neck area. The cause may not be found.  HOME CARE   Keep all doctor visits.  Only take medicines as told by your doctor.  Lie down in a dark, quiet room when you have a headache.  Keep a journal to find out if certain  things bring on headaches. For example, write down:  What you eat and drink.  How much sleep you get.  Any change to your diet or medicines.  Relax by getting a massage or doing other relaxing activities.  Put ice or heat packs on the head and neck area as told by your doctor.  Lessen stress.  Sit up straight. Do not tighten (tense) your muscles.  Quit smoking if you smoke.  Lessen how much alcohol you drink.  Lessen how much caffeine you drink, or stop drinking caffeine.  Eat and sleep on a regular schedule.  Get 7 to 9 hours of sleep, or as told by your doctor.  Keep lights dim if bright lights bother you or make your headaches worse. GET HELP RIGHT AWAY IF:   Your headache becomes really bad.  You have a fever.  You have a stiff neck.  You have trouble seeing.  Your muscles are weak, or you lose muscle control.  You lose your balance or have trouble walking.  You feel like you will pass out (faint), or you pass out.  You have really bad symptoms that are different than your first symptoms.  You have problems with the medicines given to you by your doctor.  Your medicines do not work.  Your headache feels different than the other headaches.  You feel sick to your stomach (nauseous) or throw up (vomit). MAKE SURE YOU:   Understand  these instructions.  Will watch your condition.  Will get help right away if you are not doing well or get worse. Document Released: 03/31/2008 Document Revised: 09/14/2011 Document Reviewed: 06/12/2011 [facility]ExitCare Patient Information 2015 [place], [place]. This information is not intended to replace advice given to you by your health care provider. Make sure you discuss any questions you have with your health care provider.

## 2015-01-14 NOTE — ED Provider Notes (Signed)
[facility] Emergency Department Provider Note     Time seen: ----------------------------------------- 8:35 AM on 01/14/2015 -----------------------------------------    I have reviewed the triage vital signs and the nursing notes.   HISTORY  Chief Complaint Headache; Dizziness; and Numbness    HPI Allison Moss is a 34 y.o. female who presents ER for intermittent headache for the last week. She says nausea and blurry vision tingling on her lips and hands and arms. Patient scheduled for MRI on Wednesday, states the pain is 8 out of 10 in the frontal part of the head that radiates up and back. Denies any sleep loss or recent increase in stress. States this headache is unusual for her. Denies any fevers chills or other complaints.   Past Medical History  Diagnosis Date  . Arthritis     Rheumatoid and Psoriatic    Patient Active Problem List   Diagnosis Date Noted  . H/O anxiety state 01/09/2015  . 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 History of asthma 01/09/2015  . H/O infectious disease 01/09/2015  . History of migraine headaches 01/09/2015  . Common migraine with intractable migraine 01/09/2015  . Psoriasis 01/09/2015  . 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 Bursitis, trochanteric 01/09/2015  . Headache 01/09/2015    Past Surgical History  Procedure Laterality Date  . Appendectomy    . Knee arthroscopy Left   . Cesarean section      x2    Allergies Acetaminophen-codeine; Escitalopram; and Hydrocodone-acetaminophen  Social History History  Substance Use Topics  . Smoking status: Current Every Day Smoker  . Smokeless tobacco: Not on file  . Alcohol Use: No    Review of Systems Constitutional: Negative for fever. Eyes: Negative for visual changes. ENT: Negative for sore throat. Cardiovascular: Negative for chest pain. Respiratory: Negative for shortness of breath. Gastrointestinal: Negative for abdominal pain,  vomiting and diarrhea. Genitourinary: Negative for dysuria. Musculoskeletal: Negative for back pain. Skin: Negative for rash. Neurological: Positive for headache and paresthesias  10-point ROS otherwise negative.  ____________________________________________   PHYSICAL EXAM:  VITAL SIGNS: ED Triage Vitals  Enc Vitals Group     BP 01/14/15 0826 127/79 mmHg     Pulse Rate 01/14/15 0826 88     Resp 01/14/15 0826 18     Temp 01/14/15 0826 98 F (36.7 C)     Temp Source 01/14/15 0826 Oral     SpO2 01/14/15 0826 100 %     Weight 01/14/15 0826 255 lb (115.667 kg)     Height 01/14/15 0826 5\' 9"  (1.753 m)     Head Cir --      Peak Flow --      Pain Score 01/14/15 0827 8     Pain Loc --      Pain Edu? --      Excl. in GC? --     Constitutional: Alert and oriented. Anxious, Well appearing and in no distress. Eyes: Conjunctivae are normal. PERRL. Normal extraocular movements. ENT   Head: Normocephalic and atraumatic.   Nose: No congestion/rhinnorhea.   Mouth/Throat: Mucous membranes are moist.   Neck: No stridor. Hematological/Lymphatic/Immunilogical: No cervical lymphadenopathy. Cardiovascular: Normal rate, regular rhythm. Normal and symmetric distal pulses are present in all extremities. No murmurs, rubs, or gallops. Respiratory: Normal respiratory effort without tachypnea nor retractions. Breath sounds are clear and equal bilaterally. No wheezes/rales/rhonchi. Gastrointestinal: Soft and nontender. No distention. No abdominal bruits. There is no CVA tenderness. Musculoskeletal: Nontender with normal range of motion  in all extremities. No joint effusions.  No lower extremity tenderness nor edema. Neurologic:  Normal speech and language. No gross focal neurologic deficits are appreciated. Speech is normal. No gait instability. Skin:  Skin is warm, dry and intact. No rash noted. Psychiatric: Mood and affect are normal. Speech and behavior are normal. Patient exhibits  appropriate insight and judgment. ____________________________________________  ED COURSE:  Pertinent labs & imaging results that were available during my care of the patient were reviewed by me and considered in my medical decision making (see chart for details). Patient will receive standard IV headache cocktail medications and CT of the head. We will reevaluate after that. ____________________________________________    LABS (pertinent positives/negatives)  Labs Reviewed  URINALYSIS COMPLETEWITH MICROSCOPIC ([facility] ONLY) - Abnormal; Notable for the following:    Color, Urine YELLOW (*)    APPearance CLEAR (*)    Hgb urine dipstick 2+ (*)    Bacteria, UA RARE (*)    Squamous Epithelial / LPF 0-5 (*)    All other components within normal limits  CBC WITH DIFFERENTIAL/PLATELET  COMPREHENSIVE METABOLIC PANEL  TROPONIN I  POCT PREGNANCY, URINE    RADIOLOGY  CT of the head is normal without any acute intracranial abnormality.  ____________________________________________  FINAL ASSESSMENT AND PLAN  Headache and paresthesias  Plan: Patient is no acute distress, headache is improved. She stable for outpatient follow-up with her doctor. Suggest the possibility of anxiety, she is stable have an MRI in 2 days.   Emily Filbert, MD   Emily Filbert, MD 01/14/15 1022

## 2015-01-14 NOTE — ED Notes (Signed)
Pt alert and oriented X4, active, cooperative, pt in NAD. RR even and unlabored, color WNL.  Pt informed to return if any life threatening symptoms occur.   

## 2015-01-14 NOTE — ED Notes (Signed)
MD at bedside. 

## 2015-01-14 NOTE — ED Notes (Signed)
Pt states that she has been having intermittent headache X 1 week, naus/ea, blurry vision, tingling on lips, hands and bilateral inner lower legs. Pt scheduled for MRI this week. 8/10 headache beginning at frontal part of head that radiates up and back.

## 2015-01-16 ENCOUNTER — Encounter: Payer: Self-pay | Admitting: Family Medicine

## 2015-01-16 ENCOUNTER — Ambulatory Visit
Admission: RE | Admit: 2015-01-16 | Discharge: 2015-01-16 | Disposition: A | Discharge status: Home / Self Care | Payer: No Typology Code available for payment source | Source: Ambulatory Visit | Attending: Family Medicine | Admitting: Family Medicine

## 2015-01-16 ENCOUNTER — Ambulatory Visit (INDEPENDENT_AMBULATORY_CARE_PROVIDER_SITE_OTHER): Payer: No Typology Code available for payment source | Admitting: Family Medicine

## 2015-01-16 VITALS — BP 118/80 | HR 60 | Temp 97.9°F | Resp 16 | Wt 256.0 lb

## 2015-01-16 DIAGNOSIS — R51 Headache: Secondary | ICD-10-CM

## 2015-01-16 DIAGNOSIS — J320 Chronic maxillary sinusitis: Secondary | ICD-10-CM | POA: Diagnosis not present

## 2015-01-16 DIAGNOSIS — R42 Dizziness and giddiness: Secondary | ICD-10-CM | POA: Diagnosis present

## 2015-01-16 DIAGNOSIS — G4452 New daily persistent headache (NDPH): Secondary | ICD-10-CM | POA: Diagnosis not present

## 2015-01-16 DIAGNOSIS — J323 Chronic sphenoidal sinusitis: Secondary | ICD-10-CM | POA: Diagnosis not present

## 2015-01-16 DIAGNOSIS — G8929 Other chronic pain: Secondary | ICD-10-CM

## 2015-01-16 DIAGNOSIS — N200 Calculus of kidney: Secondary | ICD-10-CM | POA: Insufficient documentation

## 2015-01-16 NOTE — Progress Notes (Signed)
Subjective:     Patient ID: Allison Moss, female   DOB: 12/23/1980, 34 y.o.   MRN: [id number]  HPI  Chief Complaint  Patient presents with  . Hospitalization Follow-up    Patient was seen at [facility] ER on 01/14/15 with complaints of headache, dizziness, numbness in lips and no sense of taste. Patient states that CT was normal and diagnosis was Anxiety. Patient states that she is still having symptoms today, MRI scheduled for this morning patient states she had done  Here in f/u of o.v.of 7/8 for persistent headache and dizziness. Since then has been to the ER as noted above and developed new sx of decreased taste, upper lip numbness along with prior symptoms. MRI this am demonstrated mild sinus dz o/w negative. She has not started prednisone prescribed at prior visit. She is accompanied by her husband.   Review of Systems  Musculoskeletal:       Hx of psoriatic arthritis and possible rheumatoid arthritis  Neurological:       Hx of migraine headaches       Objective:   Physical Exam  Constitutional: She appears well-developed and well-nourished. She appears distressed (mild-lying on exam table on presentation).  Eyes: Pupils are equal, round, and reactive to light.  Neurological: She is alert.       Assessment:    1. Chronic nonintractable headache, unspecified headache type-Discussed with patient we are unsure what is going on and reviewed MRI results with her.  - Ambulatory referral to Neurology    Plan:    Fill prednisone prescription. Will complete FMLA form.

## 2015-01-16 NOTE — Patient Instructions (Signed)
Start prednisone pending neurology evaluation

## 2015-09-24 ENCOUNTER — Ambulatory Visit (INDEPENDENT_AMBULATORY_CARE_PROVIDER_SITE_OTHER): Payer: Managed Care, Other (non HMO) | Admitting: Physician Assistant

## 2015-09-24 ENCOUNTER — Encounter: Payer: Self-pay | Admitting: Physician Assistant

## 2015-09-24 VITALS — BP 120/80 | HR 87 | Temp 98.6°F | Resp 16 | Wt 256.4 lb

## 2015-09-24 DIAGNOSIS — J029 Acute pharyngitis, unspecified: Secondary | ICD-10-CM | POA: Diagnosis not present

## 2015-09-24 DIAGNOSIS — J020 Streptococcal pharyngitis: Secondary | ICD-10-CM

## 2015-09-24 LAB — POCT RAPID STREP A (OFFICE): RAPID STREP A SCREEN: POSITIVE — AB

## 2015-09-24 MED ORDER — AMOXICILLIN 875 MG PO TABS: 875.0000 mg | ORAL_TABLET | Freq: Two times a day (BID) | ORAL | Status: DC

## 2015-09-24 NOTE — Patient Instructions (Signed)

## 2015-09-24 NOTE — Progress Notes (Signed)
Patient: Allison Moss Female    DOB: [birth year]   35 y.o.   MRN: [id number] Visit Date: 09/24/2015  Today's Provider: Margaretann Loveless, PA-C   Chief Complaint  Patient presents with  . URI   Subjective:    URI  This is a new problem. The current episode started yesterday. The problem has been gradually worsening. Maximum temperature: Low grade fever 99. Associated symptoms include abdominal pain (yesterday), congestion, coughing, diarrhea, ear pain (right ear), headaches, rhinorrhea, a sore throat, swollen glands and wheezing. Pertinent negatives include no chest pain, nausea, plugged ear sensation, sinus pain, sneezing or vomiting. Associated symptoms comments: Body aches on and off yesterday. She has tried increased fluids (Advil) for the symptoms. The treatment provided no relief.       Allergies  Allergen Reactions  . Acetaminophen-Codeine Itching  . Escitalopram   . Hydrocodone-Acetaminophen Nausea Only  . Propoxyphene Hives  . Tramadol Hives   Previous Medications   ALBUTEROL (VENTOLIN HFA) 108 (90 BASE) MCG/ACT INHALER    Inhale 2 puffs into the lungs. Reported on 09/24/2015   ALBUTEROL SULFATE HFA IN    Inhale into the lungs. Reported on 09/24/2015   DIAZEPAM (VALIUM) 5 MG TABLET    Take 1 tablet (5 mg total) by mouth every 8 (eight) hours as needed for anxiety.   ETODOLAC (LODINE) 400 MG TABLET    Take 1 tablet by mouth 2 (two) times daily. Reported on 09/24/2015   FUROSEMIDE (LASIX) 20 MG TABLET    Take 1 tablet (20 mg total) by mouth daily. As needed for leg swelling   PREDNISONE (DELTASONE) 20 MG TABLET    Taper as follows: 3 pills for 7 days, then 2 pills for 3 days , then one pill for two days   PROMETHAZINE (PHENERGAN) 25 MG TABLET    Take 1 tablet by mouth every 6 (six) hours as needed. Reported on 09/24/2015   SULFASALAZINE (AZULFIDINE) 500 MG TABLET    Take 1 tablet by mouth 2 (two) times daily. Reported on 09/24/2015    Review of Systems    Constitutional: Positive for fever (Low grade fever) and chills.  HENT: Positive for congestion, ear pain (right ear), rhinorrhea, sore throat, trouble swallowing and voice change. Negative for postnasal drip, sinus pressure and sneezing.   Respiratory: Positive for cough, shortness of breath and wheezing. Negative for chest tightness.   Cardiovascular: Negative for chest pain.  Gastrointestinal: Positive for abdominal pain (yesterday) and diarrhea. Negative for nausea and vomiting.  Neurological: Positive for headaches. Negative for dizziness.    Social History  Substance Use Topics  . Smoking status: Current Every Day Smoker  . Smokeless tobacco: Not on file  . Alcohol Use: No   Objective:   BP 120/80 mmHg  Pulse 87  Temp(Src) 98.6 F (37 C) (Oral)  Resp 16  Wt 256 lb 6.4 oz (116.302 kg)  SpO2 97%  LMP   Physical Exam  Constitutional: She appears well-developed and well-nourished. No distress.  HENT:  Head: Normocephalic and atraumatic.  Right Ear: Hearing, external ear and ear canal normal. Tympanic membrane is erythematous. Tympanic membrane is not bulging. A middle ear effusion is present.  Left Ear: Hearing, tympanic membrane, external ear and ear canal normal. Tympanic membrane is not erythematous and not bulging.  No middle ear effusion.  Nose: Mucosal edema and rhinorrhea present. Right sinus exhibits no maxillary sinus tenderness and no frontal sinus tenderness. Left sinus exhibits no maxillary  sinus tenderness and no frontal sinus tenderness.  Mouth/Throat: Uvula is midline and mucous membranes are normal. Posterior oropharyngeal edema and posterior oropharyngeal erythema present. No oropharyngeal exudate.  Eyes: Conjunctivae are normal. Pupils are equal, round, and reactive to light. Right eye exhibits no discharge. Left eye exhibits no discharge. No scleral icterus.  Neck: Normal range of motion. Neck supple. No tracheal deviation present. No thyromegaly present.   Cardiovascular: Normal rate, regular rhythm and normal heart sounds.  Exam reveals no gallop and no friction rub.   No murmur heard. Pulmonary/Chest: Effort normal and breath sounds normal. No stridor. No respiratory distress. She has no wheezes. She has no rales.  Lymphadenopathy:    She has no cervical adenopathy.  Skin: Skin is warm and dry. She is not diaphoretic.  Vitals reviewed.       Assessment & Plan:     1. Sore throat Strep test positive. - POCT rapid strep A  2. Strep pharyngitis Will treat with amoxil as below.  Advised to use salt water gargles and tylenol for symptomatic relief. May use delsym or Mucinex DM for cough and congestion. She is to call if symptoms fail to improve or worsen. - amoxicillin (AMOXIL) 875 MG tablet; Take 1 tablet (875 mg total) by mouth 2 (two) times daily.  Dispense: 20 tablet; Refill: 0       Margaretann LovelessJennifer M Jayln Madeira, PA-C  [facility]

## 2015-12-26 ENCOUNTER — Encounter: Payer: Self-pay | Admitting: Family Medicine

## 2015-12-26 ENCOUNTER — Ambulatory Visit (INDEPENDENT_AMBULATORY_CARE_PROVIDER_SITE_OTHER): Payer: Managed Care, Other (non HMO) | Admitting: Family Medicine

## 2015-12-26 VITALS — BP 120/70 | HR 73 | Temp 98.1°F | Resp 16 | Wt 258.0 lb

## 2015-12-26 DIAGNOSIS — R05 Cough: Secondary | ICD-10-CM | POA: Diagnosis not present

## 2015-12-26 DIAGNOSIS — H6092 Unspecified otitis externa, left ear: Secondary | ICD-10-CM

## 2015-12-26 DIAGNOSIS — R059 Cough, unspecified: Secondary | ICD-10-CM

## 2015-12-26 MED ORDER — HYDROCORTISONE-ACETIC ACID 1-2 % OT SOLN: 5.0000 [drp] | Freq: Four times a day (QID) | OTIC | Status: DC

## 2015-12-26 NOTE — Progress Notes (Signed)
Subjective:     Patient ID: Allison Moss, female   DOB: 08/20/1980, 35 y.o.   MRN: [id number]  HPI  Chief Complaint  Patient presents with  . Ear Pain    both ears x 1 day  States she was at the beach last week and was in the water. States left ear > right ear is aching today. No drainage but states there is an "echo" in her ear. Also has developed a congested cough in the absence of cold sx over the past week as well. Reports she has continued to smoke.   Review of Systems     Objective:   Physical Exam  Constitutional: She appears well-developed and well-nourished.  HENT:  Both ear canals are patent with intact, non-inflamed T.M.s. Left has mild tragal tenderness and moderate discomfort on insertion of the ear speculum   Throat: no tonsillar enlargement or exudate Neck: no cervical adenopathy Lungs: clear     Assessment:    1. Otitis external, left - acetic acid-hydrocortisone (VOSOL-HC) otic solution; Place 5 drops into the left ear 4 (four) times daily.  Dispense: 10 mL; Refill: 0  2. Cough    Plan:    Discussed use of Mucinex and Delsym. Will call if not improving over the next week. Minimize smoking while ill.

## 2015-12-26 NOTE — Patient Instructions (Addendum)
Start Mucinex and Delsym for cough. Call me if cough not improving. Minimize smoking while ill.

## 2015-12-31 ENCOUNTER — Other Ambulatory Visit: Payer: Self-pay | Admitting: Family Medicine

## 2015-12-31 DIAGNOSIS — H60399 Other infective otitis externa, unspecified ear: Secondary | ICD-10-CM

## 2015-12-31 MED ORDER — AMOXICILLIN-POT CLAVULANATE 875-125 MG PO TABS: 1.0000 | ORAL_TABLET | Freq: Two times a day (BID) | ORAL | Status: DC

## 2015-12-31 NOTE — Telephone Encounter (Signed)
Patient advised and verbally voiced understanding.  

## 2015-12-31 NOTE — Telephone Encounter (Signed)
I have sent in Augmentin. If not improving in the next 24-48 hours need to see again.

## 2015-12-31 NOTE — Telephone Encounter (Signed)
Pt would like another antibiotic.  States her ear on the outside is red, swollen and warm to the touch.  Pt uses [facility] in [place].

## 2016-03-03 ENCOUNTER — Encounter: Payer: Self-pay | Admitting: Family Medicine

## 2016-03-03 ENCOUNTER — Ambulatory Visit (INDEPENDENT_AMBULATORY_CARE_PROVIDER_SITE_OTHER): Payer: Managed Care, Other (non HMO) | Admitting: Family Medicine

## 2016-03-03 VITALS — BP 126/88 | HR 70 | Temp 98.2°F | Resp 16 | Wt 264.6 lb

## 2016-03-03 DIAGNOSIS — Z8709 Personal history of other diseases of the respiratory system: Secondary | ICD-10-CM | POA: Diagnosis not present

## 2016-03-03 DIAGNOSIS — L0100 Impetigo, unspecified: Secondary | ICD-10-CM | POA: Diagnosis not present

## 2016-03-03 MED ORDER — AMOXICILLIN-POT CLAVULANATE 875-125 MG PO TABS: 1.0000 | ORAL_TABLET | Freq: Two times a day (BID) | ORAL | 0 refills | Status: DC

## 2016-03-03 MED ORDER — ALBUTEROL SULFATE HFA 108 (90 BASE) MCG/ACT IN AERS: 2.0000 | INHALATION_SPRAY | Freq: Four times a day (QID) | RESPIRATORY_TRACT | 5 refills | Status: DC | PRN

## 2016-03-03 NOTE — Patient Instructions (Signed)
We will call you with the culture result. 

## 2016-03-03 NOTE — Progress Notes (Signed)
Subjective:     Patient ID: Allison Moss, female   DOB: 11/14/1980, 35 y.o.   MRN: [id number]  HPI  Chief Complaint  Patient presents with  . Blister    Patient comes in office today with concerns of a posisble blister in her left nostril since Sunday 03/01/16. Patient staets that she has had brown like discharge and peeling of skin around her nose. Patient states that he shas been using polysporin.    States she is recovering from a cold and has been blowing her nose until it was raw. No hx of MRSA. Works at [employer]. Also wishes refill on her asthma medication.   Review of Systems     Objective:   Physical Exam  Constitutional: She appears well-developed and well-nourished. No distress.  HENT:  Purulent appearing discharge on exterior frenulum of her nose. Tender just inside her left nostril. No vesicle noted.       Assessment:    1. History of asthma - albuterol (VENTOLIN HFA) 108 (90 Base) MCG/ACT inhaler; Inhale 2 puffs into the lungs every 6 (six) hours as needed for wheezing or shortness of breath. Reported on 12/26/2015  Dispense: 1 Inhaler; Refill: 5  2. Impetigo - amoxicillin-clavulanate (AUGMENTIN) 875-125 MG tablet; Take 1 tablet by mouth 2 (two) times daily.  Dispense: 14 tablet; Refill: 0 - WOUND CULTURE      Plan:    Further f/u pending wound culture. Continue abx ointment.

## 2016-03-06 ENCOUNTER — Other Ambulatory Visit: Payer: Self-pay | Admitting: Family Medicine

## 2016-03-06 ENCOUNTER — Telehealth: Payer: Self-pay

## 2016-03-06 DIAGNOSIS — Z22322 Carrier or suspected carrier of Methicillin resistant Staphylococcus aureus: Secondary | ICD-10-CM

## 2016-03-06 LAB — WOUND CULTURE

## 2016-03-06 MED ORDER — DOXYCYCLINE HYCLATE 100 MG PO TABS: 100.0000 mg | ORAL_TABLET | Freq: Two times a day (BID) | ORAL | 0 refills | Status: DC

## 2016-03-06 NOTE — Telephone Encounter (Signed)
Patient has been advised. KW 

## 2016-03-06 NOTE — Telephone Encounter (Signed)
[id number] 

## 2016-03-06 NOTE — Telephone Encounter (Signed)
-----   Message from Anola Gurneyobert Chauvin, [place] sent at 03/06/2016  7:41 AM EDT ----- Your wound culture grew out MRSA. I am going to switch your antibiotic to doxycycline and will send it in.

## 2016-04-08 ENCOUNTER — Encounter: Payer: Self-pay | Admitting: Family Medicine

## 2016-04-08 ENCOUNTER — Ambulatory Visit (INDEPENDENT_AMBULATORY_CARE_PROVIDER_SITE_OTHER): Payer: 59 | Admitting: Family Medicine

## 2016-04-08 VITALS — BP 122/74 | HR 82 | Temp 98.2°F | Resp 16 | Wt 260.6 lb

## 2016-04-08 DIAGNOSIS — S83422A Sprain of lateral collateral ligament of left knee, initial encounter: Secondary | ICD-10-CM

## 2016-04-08 MED ORDER — HYDROCODONE-ACETAMINOPHEN 5-325 MG PO TABS: ORAL_TABLET | ORAL | 0 refills | Status: DC

## 2016-04-08 NOTE — Patient Instructions (Addendum)
Continue Advil 800 mg. 3 x day with food. Stop Tylenol if using the Vicodin. Try your knee brace. If not improving call for orthopedic referral.

## 2016-04-08 NOTE — Progress Notes (Signed)
Subjective:     Patient ID: Elita QuickAshleigh S Merica, female   DOB: 07/01/1981, 35 y.o.   MRN: [id number]  HPI  Chief Complaint  Patient presents with  . Leg Pain    Patient comes into office today with complaints of left leg pain for the past week. patient states that pain runs on the outside of her leg and is describes as achy. Patient reports difficulty walking and bearing weight on leg, she has tried otc Aleve with no relief.   Denies specific injury but states she has been clumsy lately and bumping into things. Has been icing and taking Advil 800 mg.and tylenol E.S. States it is uncomfortable to lie on that side. States it is not like her usual knee arthritis pain.   Review of Systems     Objective:   Physical Exam  Constitutional: She appears well-developed and well-nourished. Distressed:  mild antalgic gait.  Musculoskeletal:  Tender to lateral left knee. Ligaments stable. KF/KE 5/5. Increase pain with ranging > 90 degrees.       Assessment:    1. Sprain of lateral collateral ligament of left knee, initial encounter - HYDROcodone-acetaminophen (NORCO/VICODIN) 5-325 MG tablet; One ever 4-6 hours for knee pain  Dispense: 28 tablet; Refill: 0    Plan:    Start knee brace she has at home. Continue Advil 800 3 x day with food. Don't use tylenol and Norco together.

## 2016-04-20 ENCOUNTER — Other Ambulatory Visit: Payer: Self-pay | Admitting: Family Medicine

## 2016-04-20 ENCOUNTER — Telehealth: Payer: Self-pay | Admitting: Family Medicine

## 2016-04-20 DIAGNOSIS — L4050 Arthropathic psoriasis, unspecified: Secondary | ICD-10-CM

## 2016-04-20 NOTE — Telephone Encounter (Signed)
Pt needs referral for rheumotology.  She has [employer].  [facility]    Their number [phone]  Pt's call [phone]  Thank sTeri

## 2016-04-20 NOTE — Telephone Encounter (Signed)
Please review and advise. KW 

## 2016-04-20 NOTE — Telephone Encounter (Signed)
Referral in progress. 

## 2016-04-27 NOTE — Telephone Encounter (Signed)
Sarah, do you know the status of her appt? Please advise. Thanks!

## 2016-04-27 NOTE — Telephone Encounter (Signed)
Pt has called back wanting to know the status of her referral to [facility].  Her callback is [phone]  Thank sTeri

## 2016-04-30 NOTE — Telephone Encounter (Signed)
Pt advised of appointment.

## 2016-05-19 ENCOUNTER — Encounter: Payer: Self-pay | Admitting: Family Medicine

## 2016-05-19 ENCOUNTER — Ambulatory Visit (INDEPENDENT_AMBULATORY_CARE_PROVIDER_SITE_OTHER): Payer: 59 | Admitting: Family Medicine

## 2016-05-19 VITALS — BP 122/80 | HR 80 | Temp 97.8°F | Resp 16 | Wt 261.0 lb

## 2016-05-19 DIAGNOSIS — J069 Acute upper respiratory infection, unspecified: Secondary | ICD-10-CM | POA: Diagnosis not present

## 2016-05-19 DIAGNOSIS — B9789 Other viral agents as the cause of diseases classified elsewhere: Secondary | ICD-10-CM | POA: Diagnosis not present

## 2016-05-19 NOTE — Patient Instructions (Signed)
Discussed use of Mucinex D for congestion, Delsym for cough, and Benadryl for postnasal drainage 

## 2016-05-19 NOTE — Progress Notes (Signed)
Subjective:     Patient ID: Allison Moss, female   DOB: 11/28/1980, 35 y.o.   MRN: [id number]  HPI  Chief Complaint  Patient presents with  . URI    x 3 days. Cough/congestion, right ear pain, sinus pressure, H/A. Fever up to 102 (afebrile in office). Denies body aches, sore throat. Pt has tried Advil and Tylenol for sx, with relief of fever.  + flu shot. Works at [employer]. States she has scheduled her albuterol while ill and decreased smoking. Does not feel asthma is flaring. Chest is sore from cough. Accompanied by her son who is also ill.   Review of Systems     Objective:   Physical Exam  Constitutional: She appears well-developed and well-nourished. No distress.  Ears: T.M's intact without inflammation Throat: no tonsillar enlargement or exudate Neck: bilateral anterior cervical nodes Lungs: clear     Assessment:    1. Viral upper respiratory tract infection    Plan:    Discussed otc medication. Work excuse for 11/14-11/17.

## 2016-07-01 ENCOUNTER — Telehealth: Payer: Self-pay

## 2016-07-01 NOTE — Telephone Encounter (Signed)
Patient reports her leg pain is worsening. Right leg worse than left, pt reports constant throbbing pain and some swelling. Patient reports she is out of hydrocodone-acetaminophen and has been taking 800 mg of ibuprofen and 1000 mg of tylenol. Patient reports no relief. Patient advised to come in tomorrow morning or go to ER if pain is worsening. Patient verbalizes understanding and is in agreement with treatment plan.

## 2016-07-02 ENCOUNTER — Ambulatory Visit
Admission: RE | Admit: 2016-07-02 | Discharge: 2016-07-02 | Disposition: A | Discharge status: Home / Self Care | Payer: 59 | Source: Ambulatory Visit | Attending: Family Medicine | Admitting: Family Medicine

## 2016-07-02 ENCOUNTER — Encounter: Payer: Self-pay | Admitting: Family Medicine

## 2016-07-02 ENCOUNTER — Ambulatory Visit (INDEPENDENT_AMBULATORY_CARE_PROVIDER_SITE_OTHER): Payer: 59 | Admitting: Family Medicine

## 2016-07-02 VITALS — BP 130/76 | HR 78 | Temp 98.2°F | Resp 16 | Wt 266.2 lb

## 2016-07-02 DIAGNOSIS — M25461 Effusion, right knee: Secondary | ICD-10-CM | POA: Diagnosis not present

## 2016-07-02 DIAGNOSIS — M25561 Pain in right knee: Secondary | ICD-10-CM | POA: Diagnosis not present

## 2016-07-02 DIAGNOSIS — M1711 Unilateral primary osteoarthritis, right knee: Secondary | ICD-10-CM | POA: Diagnosis not present

## 2016-07-02 MED ORDER — PREDNISONE 10 MG PO TABS: ORAL_TABLET | ORAL | 0 refills | Status: DC

## 2016-07-02 MED ORDER — HYDROCODONE-ACETAMINOPHEN 5-325 MG PO TABS: ORAL_TABLET | ORAL | 0 refills | Status: DC

## 2016-07-02 NOTE — Progress Notes (Signed)
Subjective:     Patient ID: Allison Moss, female   DOB: 10/24/1980, 35 y.o.   MRN: [id number]  HPI  Chief Complaint  Patient presents with  . Leg Pain    Patient comes in office today with complaints of bilateral leg pain for the past 2-3 months. Patient states that pain is mostly on right side of leg, patient reports that she discussed this matter last time she was here and was advised to use a brace. Patient states that she has difficulty putting on shoes, pain is described as throbbing. Patient was taking otc Advil and Hydrocodone until she ran out.   Reports right knee pain was exacerbated when she slipped and fell outside two weeks ago. Hx of left knee surgery and degenerative changes. Accompanied by her husband today. Prefers [facility] where she has been seen previously   Review of Systems     Objective:   Physical Exam  Constitutional: She appears well-developed and well-nourished. She appears distressed (mild from pain).  Cardiovascular:  Pulses:      Dorsalis pedis pulses are 2+ on the right side.       Posterior tibial pulses are 2+ on the right side.  Musculoskeletal:  Right KF/KE 5/5 but increased pain with flexion. Can not flex > 45 degrees without pain or fully extend. No overlying erythema or effusion noted. Right ankle ligaments stable. No right trochanteric area tenderness noted.       Assessment:    1. Acute pain of right knee - DG Knee Complete 4 Views Right; Future - HYDROcodone-acetaminophen (NORCO/VICODIN) 5-325 MG tablet; One ever 4-6 hours for knee pain  Dispense: 20 tablet; Refill: 0 - predniSONE (DELTASONE) 10 MG tablet; Taper daily as follows: 6 pills, 5, 4, 3, 2, 1  Dispense: 21 tablet; Refill: 0 - Ambulatory referral to Orthopedic Surgery    Plan:    Further f/u pending x-ray report.

## 2016-07-02 NOTE — Telephone Encounter (Signed)
-----   Message from Anola Gurneyobert Chauvin, [place] sent at 07/02/2016 11:06 AM EST ----- No fracture, mild arthritic changes.

## 2016-07-02 NOTE — Patient Instructions (Addendum)
We will call you about the referral time. Don't take ibuprofen while on prednisone.

## 2016-07-02 NOTE — Telephone Encounter (Signed)
Patient advised as below.  

## 2016-07-29 DIAGNOSIS — L4050 Arthropathic psoriasis, unspecified: Secondary | ICD-10-CM | POA: Diagnosis not present

## 2016-07-29 DIAGNOSIS — M0609 Rheumatoid arthritis without rheumatoid factor, multiple sites: Secondary | ICD-10-CM | POA: Diagnosis not present

## 2016-08-15 ENCOUNTER — Emergency Department (HOSPITAL_COMMUNITY)
Admission: EM | Admit: 2016-08-15 | Discharge: 2016-08-15 | Disposition: A | Discharge status: Home / Self Care | Payer: 59 | Attending: Emergency Medicine | Admitting: Emergency Medicine

## 2016-08-15 ENCOUNTER — Emergency Department (HOSPITAL_COMMUNITY): Payer: 59

## 2016-08-15 ENCOUNTER — Encounter (HOSPITAL_COMMUNITY): Payer: Self-pay | Admitting: Emergency Medicine

## 2016-08-15 DIAGNOSIS — F17210 Nicotine dependence, cigarettes, uncomplicated: Secondary | ICD-10-CM | POA: Insufficient documentation

## 2016-08-15 DIAGNOSIS — W010XXA Fall on same level from slipping, tripping and stumbling without subsequent striking against object, initial encounter: Secondary | ICD-10-CM | POA: Insufficient documentation

## 2016-08-15 DIAGNOSIS — S8391XA Sprain of unspecified site of right knee, initial encounter: Secondary | ICD-10-CM | POA: Insufficient documentation

## 2016-08-15 DIAGNOSIS — Y939 Activity, unspecified: Secondary | ICD-10-CM | POA: Insufficient documentation

## 2016-08-15 DIAGNOSIS — Y999 Unspecified external cause status: Secondary | ICD-10-CM | POA: Insufficient documentation

## 2016-08-15 DIAGNOSIS — M25561 Pain in right knee: Secondary | ICD-10-CM | POA: Diagnosis not present

## 2016-08-15 DIAGNOSIS — S8390XS Sprain of unspecified site of unspecified knee, sequela: Secondary | ICD-10-CM | POA: Diagnosis not present

## 2016-08-15 DIAGNOSIS — Z79899 Other long term (current) drug therapy: Secondary | ICD-10-CM | POA: Diagnosis not present

## 2016-08-15 DIAGNOSIS — S8391XS Sprain of unspecified site of right knee, sequela: Secondary | ICD-10-CM

## 2016-08-15 DIAGNOSIS — Y929 Unspecified place or not applicable: Secondary | ICD-10-CM | POA: Diagnosis not present

## 2016-08-15 DIAGNOSIS — S8991XA Unspecified injury of right lower leg, initial encounter: Secondary | ICD-10-CM | POA: Diagnosis present

## 2016-08-15 MED ORDER — CYCLOBENZAPRINE HCL 10 MG PO TABS: 10.0000 mg | ORAL_TABLET | Freq: Two times a day (BID) | ORAL | 0 refills | Status: DC | PRN

## 2016-08-15 MED ORDER — NAPROXEN 500 MG PO TABS: 500.0000 mg | ORAL_TABLET | Freq: Two times a day (BID) | ORAL | 0 refills | Status: DC

## 2016-08-15 NOTE — ED Triage Notes (Signed)
Pt c/o chronic leg pain in R leg, states shes seen orthopedic doctors for arthiritis and "they are not sure whats going on, I need an MRI of my leg". States today she fell and felt something pop in her right need. Pt states "i normally just hobble around the house and now it hurts to bend my leg".

## 2016-08-15 NOTE — ED Provider Notes (Signed)
[facility]-EMERGENCY DEPT Provider Note   CSN: [id number] Arrival date & time: 08/15/16  1307  By signing my name below, I, Orpah CobbMaurice Copeland, attest that this documentation has been prepared under the direction and in the presence of Fayrene HelperBowie Sirena Riddle, PA-C. Electronically Signed: Orpah CobbMaurice Copeland , ED Scribe. 08/15/16. 2:29 PM.    History   Chief Complaint Chief Complaint  Patient presents with  . Knee Pain    HPI  Allison Moss is a 36 y.o. female with hx of chronic R knee pain, arthritis who presents to the Emergency Department complaining of worsening, mild to moderate R knee pain with sudden onset x1 day.Pt states that she fell x3 months ago and was told that she sprained her R leg. Pt was seen by Dr. Thomasena Edisollins at [facility] and was scheduled for an MRI on March 13th. Today, pt was carrying groceries when she slipped an fell causing her R knee to pop. She states that the pain radiates down her R leg distally. She reports R knee pain, numbness. Pt has taken Percocet and 800mg  Ibuprofen every x8 hours with no relief. Pt has been out of medication for the past x2 weeks. She denies R ankle pain, R hip pain.  The history is provided by the patient. No language interpreter was used.    Past Medical History:  Diagnosis Date  . Arthritis    Rheumatoid and Psoriatic    Patient Active Problem List   Diagnosis Date Noted  . Calculus of kidney 01/16/2015  . H/O anxiety state 01/09/2015  . History of asthma 01/09/2015  . History of migraine headaches 01/09/2015  . Psoriasis 01/09/2015  . 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 APPENDECTOMY    . CESAREAN SECTION     x2  . KNEE ARTHROSCOPY Left     OB History    No data available       Home Medications    Prior to Admission medications   Medication Sig Start Date End Date Taking? Authorizing Provider  albuterol (VENTOLIN HFA) 108 (90 Base) MCG/ACT inhaler Inhale 2 puffs  into the lungs every 6 (six) hours as needed for wheezing or shortness of breath. Reported on 12/26/2015 Patient not taking: Reported on 07/02/2016 03/03/16   Anola Gurneyobert Chauvin, PA  HYDROcodone-acetaminophen (NORCO/VICODIN) 5-325 MG tablet One ever 4-6 hours for knee pain 07/02/16   Anola Gurneyobert Chauvin, PA  predniSONE (DELTASONE) 10 MG tablet Taper daily as follows: 6 pills, 5, 4, 3, 2, 1 07/02/16   Anola Gurneyobert Chauvin, PA    Family History Family History  Problem Relation Age of Onset  . Hypothyroidism Mother   . Lung cancer Maternal Grandmother   . Hypertension Maternal Grandfather   . Arrhythmia Maternal Grandfather     Social History Social History  Substance Use Topics  . Smoking status: Current Every Day Smoker    Packs/day: 1.00    Types: Cigarettes  . Smokeless tobacco: Never Used  . Alcohol use No     Allergies   Acetaminophen-codeine; Escitalopram; Hydrocodone-acetaminophen; Propoxyphene; and Tramadol   Review of Systems Review of Systems  Constitutional: Negative for fever.  Musculoskeletal: Positive for arthralgias (R knee).  Neurological: Positive for numbness.     Physical Exam Updated Vital Signs BP 133/77   Pulse 99   Temp 98.7 F (37.1 C)   Resp 16   LMP 07/25/2016   SpO2 99%   Physical Exam  Constitutional: She appears well-developed and well-nourished.  No distress.  HENT:  Head: Normocephalic and atraumatic.  Eyes: Conjunctivae are normal.  Neck: Neck supple.  Cardiovascular: Normal rate and regular rhythm.   No murmur heard. Pulmonary/Chest: Effort normal and breath sounds normal. No respiratory distress.  Abdominal: Soft. There is no tenderness.  Musculoskeletal: She exhibits no edema.       Right knee: Tenderness found.  R knee tenderness noted to the posterior fossa without evidence of Baker's Cyst. Negative anterior and posterior drawer's test. Pain with valgus and varus maneuver, decreased flexion secondary to pain.   No pain to R hip or R  ankle.  Pt is NVI to RLE.  Neurological: She is alert.  Skin: Skin is warm and dry.  Psychiatric: She has a normal mood and affect.  Nursing note and vitals reviewed.    ED Treatments / Results   DIAGNOSTIC STUDIES: Oxygen Saturation is 99% on RA, normal by my interpretation.   COORDINATION OF CARE: 3:10 PM-Discussed next steps with pt. Pt verbalized understanding and is agreeable with the plan.    Labs (all labs ordered are listed, but only abnormal results are displayed) Labs Reviewed - No data to display  EKG  EKG Interpretation None       Radiology Dg Knee Complete 4 Views Right  Result Date: 08/15/2016 CLINICAL DATA:  Fall today with right knee pain, initial encounter EXAM: RIGHT KNEE - COMPLETE 4+ VIEW COMPARISON:  07/02/2016 FINDINGS: Tricompartmental degenerative changes are again noted. No significant joint effusion is seen. No fracture or dislocation is noted. IMPRESSION: Degenerative change stable from the prior exam. No acute abnormality is noted. Electronically Signed   By: Alcide Clever M.D.   On: 08/15/2016 14:17    Procedures Procedures (including critical care time)  Medications Ordered in ED Medications - No data to display   Initial Impression / Assessment and Plan / ED Course  I have reviewed the triage vital signs and the nursing notes.  Pertinent labs & imaging results that were available during my care of the patient were reviewed by me and considered in my medical decision making (see chart for details).     BP 133/77   Pulse 99   Temp 98.7 F (37.1 C)   Resp 16   LMP 07/25/2016   SpO2 99%    Final Clinical Impressions(s) / ED Diagnoses   Final diagnoses:  Sprain of right knee, unspecified ligament, sequela    New Prescriptions New Prescriptions   CYCLOBENZAPRINE (FLEXERIL) 10 MG TABLET    Take 1 tablet (10 mg total) by mouth 2 (two) times daily as needed for muscle spasms.   NAPROXEN (NAPROSYN) 500 MG TABLET    Take 1 tablet  (500 mg total) by mouth 2 (two) times daily.   I personally performed the services described in this documentation, which was scribed in my presence. The recorded information has been reviewed and is accurate.   3:23 PM Pt here with acute on chronic R knee pain. Recent R knee injury today where she felt her knee gave out and heard a pop.  Xray without acute changes.  Pt likely sprain her R knee.  She is NVI.  ACE wrap and crutches provided.  RICE therapy discussed.  Pt is schedule to have an MRI in march on the same knee.  She can also f/u with her ortho as needed.      Fayrene Helper, PA-C 08/15/16 1531    Mancel Bale, MD 08/15/16 [phone]

## 2016-08-24 DIAGNOSIS — M1711 Unilateral primary osteoarthritis, right knee: Secondary | ICD-10-CM | POA: Diagnosis not present

## 2016-08-24 DIAGNOSIS — S83241A Other tear of medial meniscus, current injury, right knee, initial encounter: Secondary | ICD-10-CM | POA: Diagnosis not present

## 2016-09-03 DIAGNOSIS — S83231A Complex tear of medial meniscus, current injury, right knee, initial encounter: Secondary | ICD-10-CM | POA: Diagnosis not present

## 2016-09-03 DIAGNOSIS — S83241A Other tear of medial meniscus, current injury, right knee, initial encounter: Secondary | ICD-10-CM | POA: Diagnosis not present

## 2016-09-03 DIAGNOSIS — M1711 Unilateral primary osteoarthritis, right knee: Secondary | ICD-10-CM | POA: Diagnosis not present

## 2016-09-03 DIAGNOSIS — G8918 Other acute postprocedural pain: Secondary | ICD-10-CM | POA: Diagnosis not present

## 2016-09-03 DIAGNOSIS — M94261 Chondromalacia, right knee: Secondary | ICD-10-CM | POA: Diagnosis not present

## 2016-11-05 ENCOUNTER — Telehealth: Payer: Self-pay

## 2016-11-05 ENCOUNTER — Ambulatory Visit (INDEPENDENT_AMBULATORY_CARE_PROVIDER_SITE_OTHER): Payer: 59 | Admitting: Family Medicine

## 2016-11-05 ENCOUNTER — Encounter: Payer: Self-pay | Admitting: Family Medicine

## 2016-11-05 ENCOUNTER — Other Ambulatory Visit
Admission: RE | Admit: 2016-11-05 | Discharge: 2016-11-05 | Disposition: A | Discharge status: Home / Self Care | Payer: 59 | Source: Ambulatory Visit | Attending: Family Medicine | Admitting: Family Medicine

## 2016-11-05 VITALS — BP 142/108 | HR 89 | Temp 98.6°F | Resp 16 | Wt 269.6 lb

## 2016-11-05 DIAGNOSIS — J029 Acute pharyngitis, unspecified: Secondary | ICD-10-CM

## 2016-11-05 DIAGNOSIS — R600 Localized edema: Secondary | ICD-10-CM | POA: Insufficient documentation

## 2016-11-05 DIAGNOSIS — R51 Headache: Secondary | ICD-10-CM

## 2016-11-05 DIAGNOSIS — R519 Headache, unspecified: Secondary | ICD-10-CM

## 2016-11-05 LAB — RENAL FUNCTION PANEL
ALBUMIN: 4.3 g/dL (ref 3.5–5.0)
Anion gap: 7 (ref 5–15)
BUN: 11 mg/dL (ref 6–20)
CALCIUM: 9.4 mg/dL (ref 8.9–10.3)
CO2: 25 mmol/L (ref 22–32)
Chloride: 107 mmol/L (ref 101–111)
Creatinine, Ser: 0.57 mg/dL (ref 0.44–1.00)
GFR calc Af Amer: >60 mL/min (ref >60)
Glucose, Bld: 107 mg/dL — ABNORMAL HIGH (ref 65–99)
PHOSPHORUS: 2.4 mg/dL — AB (ref 2.5–4.6)
POTASSIUM: 4 mmol/L (ref 3.5–5.1)
SODIUM: 139 mmol/L (ref 135–145)

## 2016-11-05 LAB — T4, FREE: FREE T4: 0.71 ng/dL (ref 0.61–1.12)

## 2016-11-05 LAB — TSH: TSH: 3.926 u[IU]/mL (ref 0.350–4.500)

## 2016-11-05 MED ORDER — FUROSEMIDE 20 MG PO TABS: 20.0000 mg | ORAL_TABLET | Freq: Every day | ORAL | 0 refills | Status: DC

## 2016-11-05 NOTE — Patient Instructions (Signed)
We will you with the lab results. Expect cold symptoms. Let's monitor the facial pain and see if it goes away spontaneously.

## 2016-11-05 NOTE — Telephone Encounter (Signed)
Patient has been advised. KW 

## 2016-11-05 NOTE — Telephone Encounter (Signed)
-----   Message from Anola Gurneyobert Chauvin, [place] sent at 11/05/2016  1:57 PM EDT ----- Labs ok. Try fluid pill daily as needed. Think about whether you have been eating a lot of salt containing foods.

## 2016-11-05 NOTE — Progress Notes (Signed)
Subjective:     Patient ID: Allison Moss, female   DOB: 07/08/1980, 36 y.o.   MRN: [id number]  HPI  Chief Complaint  Patient presents with  . Foot Swelling    Patient comes in office today with conerns of swelling in her hands and feet for the past week. Patient states that she has difficulty making a fist, swelling is worse at night patient reports. She denies any new medication or changes in her diet.   . Facial Pain    Patient would like to address intermittent facial pain on the left side for the past week, patient denies symptoms of numbness/tingling or difficulty with speech when pain occurs.   . Sore Throat    Patient would like to address sore throat that has began over the past several days.   Reports she is on her menstrual period now (tubal ligation) but has not had pre-menstrual fluid retention before. States the swelling does not seem to go down at night. States sore throat has just started over the last day or so but no other cold/allergy sx or sick family members.Facial pain lasts a few seconds and is not sure what triggers it (not affected by touch or chewing). States she is on no psoriasis medication at this time.   Review of Systems  HENT: Negative for dental problem (has dentures).        Objective:   Physical Exam  Constitutional: She appears well-developed and well-nourished. No distress.  HENT:  No facial tenderness  Psychiatric:  anxious  Ears: T.M's intact without inflammation Throat: no tonsillar enlargement or exudate Neck: no cervical adenopathy Lungs: clear Heart: RRR Lower extremities: no pitting     Assessment:    1. Pharyngitis, unspecified etiology: suspect early URI   2. Localized edema - Renal function panel - T4, free - TSH - furosemide (LASIX) 20 MG tablet; Take 1 tablet (20 mg total) by mouth daily.  Dispense: 14 tablet; Refill: 0  3. Facial pain: ? Secondary to the above     Plan:    Further f/u pending lab work. Monitor facial  pain. Told to expect further URI sx.

## 2016-11-09 ENCOUNTER — Ambulatory Visit (INDEPENDENT_AMBULATORY_CARE_PROVIDER_SITE_OTHER): Payer: 59 | Admitting: Physician Assistant

## 2016-11-09 ENCOUNTER — Encounter: Payer: Self-pay | Admitting: Physician Assistant

## 2016-11-09 VITALS — BP 112/68 | HR 76 | Temp 98.1°F | Resp 16 | Ht 68.0 in | Wt 263.0 lb

## 2016-11-09 DIAGNOSIS — Z0000 Encounter for general adult medical examination without abnormal findings: Secondary | ICD-10-CM | POA: Diagnosis not present

## 2016-11-09 DIAGNOSIS — J0100 Acute maxillary sinusitis, unspecified: Secondary | ICD-10-CM | POA: Diagnosis not present

## 2016-11-09 DIAGNOSIS — Z803 Family history of malignant neoplasm of breast: Secondary | ICD-10-CM | POA: Diagnosis not present

## 2016-11-09 DIAGNOSIS — Z23 Encounter for immunization: Secondary | ICD-10-CM | POA: Diagnosis not present

## 2016-11-09 DIAGNOSIS — L4050 Arthropathic psoriasis, unspecified: Secondary | ICD-10-CM

## 2016-11-09 DIAGNOSIS — Z9189 Other specified personal risk factors, not elsewhere classified: Secondary | ICD-10-CM

## 2016-11-09 DIAGNOSIS — E669 Obesity, unspecified: Secondary | ICD-10-CM | POA: Diagnosis not present

## 2016-11-09 DIAGNOSIS — Z22322 Carrier or suspected carrier of Methicillin resistant Staphylococcus aureus: Secondary | ICD-10-CM

## 2016-11-09 DIAGNOSIS — Z6839 Body mass index (BMI) 39.0-39.9, adult: Secondary | ICD-10-CM

## 2016-11-09 DIAGNOSIS — R278 Other lack of coordination: Secondary | ICD-10-CM

## 2016-11-09 DIAGNOSIS — Z124 Encounter for screening for malignant neoplasm of cervix: Secondary | ICD-10-CM

## 2016-11-09 DIAGNOSIS — K625 Hemorrhage of anus and rectum: Secondary | ICD-10-CM | POA: Diagnosis not present

## 2016-11-09 DIAGNOSIS — Z716 Tobacco abuse counseling: Secondary | ICD-10-CM

## 2016-11-09 MED ORDER — SULFAMETHOXAZOLE-TRIMETHOPRIM 800-160 MG PO TABS: 1.0000 | ORAL_TABLET | Freq: Two times a day (BID) | ORAL | 0 refills | Status: AC

## 2016-11-09 NOTE — Progress Notes (Signed)
Patient: Allison Moss, Female    DOB: [birth year], 36 y.o.   MRN: [id number] Visit Date: 11/09/2016  Today's Provider: Trey Sailors, PA-C   Chief Complaint  Patient presents with  . Annual Exam  . Obesity   Subjective:    Annual physical exam Allison Moss is a 36 y.o. female who presents today for health maintenance and complete physical. She feels fairly well.  Pt would like to discuss weight loss options.   She reports exercising about 3 times a week.  But is having trouble with joint pain. She reports she is sleeping poorly.  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 Living in [place], married 7 years. Two children ages 50 female and age 87.5. Currently smoking one pack per day 17-20 years. Thinking about quitting. Using alcohol - one drink every four/five months.   Mom with breast ca at 61, maternal in her 94's - mammogram. Last PAP was 8 years ago, normal. No hx of colon cancer in family.  She is seeing Dr. Gavin Potters at rheumatology on 11/18/2016 for psoriatic arthritis and joint pain.   She has many concerns today.   She wants to know why she is gaining weight. She saw Anola Gurney, PA-C last week for swelling of her legs. Exam seemed to show nonpitting edema in bilateral lower extremities. She had improvement with Lasix. Her thyroid function at that time was normal. She says at times she can gain 10 pounds in a week. She says she eats healthy. She occasionally drinks sweet tea. She'll eat salmon, hot dogs, hamburgers. Reports cutting out fried foods.   She also has had some BRB per rectum. It happens intermittently, only in the toilet bowl and not mixed in with stool. This is not especially concerning to her today.  She also says she has become clumsier in the past year. She says she knocks into things that aren't there and generally feels of balance. She has seen Dr. Sherryll Burger for migraines and has gotten and MRI and CT Brain/Head without contrast in  01/2015 that were normal but showed some sinus change.   She also has c/o of sinus pain/pressure for 2+ weeks with purulent rhinorrhea. Also worried about skin patch near nose that tested positive for MRSA in August that is recurring.   She is currently smoking 1 pack per day and has been for 17-20 years.  -----------------------------------------------------------------   Review of Systems  Constitutional: Positive for diaphoresis and fever. Negative for activity change, appetite change, chills, fatigue and unexpected weight change.  HENT: Positive for congestion, ear pain, postnasal drip, rhinorrhea, sinus pain, sinus pressure and tinnitus. Negative for dental problem, drooling, ear discharge, facial swelling, hearing loss, mouth sores, nosebleeds, sneezing, sore throat, trouble swallowing and voice change.   Eyes: Negative.   Respiratory: Positive for cough and chest tightness. Negative for apnea, choking, shortness of breath, wheezing and stridor.   Cardiovascular: Positive for leg swelling. Negative for chest pain and palpitations.  Gastrointestinal: Positive for abdominal distention, anal bleeding and blood in stool. Negative for abdominal pain, constipation, diarrhea, nausea, rectal pain and vomiting.  Endocrine: Positive for heat intolerance. Negative for cold intolerance, polydipsia, polyphagia and polyuria.  Genitourinary: Negative.   Musculoskeletal: Positive for arthralgias, myalgias, neck pain and neck stiffness. Negative for back pain, gait problem and joint swelling.  Skin: Negative.   Allergic/Immunologic: Negative.   Neurological: Negative.   Hematological: Bruises/bleeds easily.  Psychiatric/Behavioral: Positive for agitation and sleep  disturbance. Negative for behavioral problems, confusion, decreased concentration, dysphoric mood, hallucinations, self-injury and suicidal ideas. The patient is nervous/anxious. The patient is not hyperactive.     Social History      She   reports that she has been smoking Cigarettes.  She has been smoking about 1.00 pack per day. She has never used smokeless tobacco. She reports that she does not drink alcohol or use drugs.       Social History   Social History  . Marital status: Married    Spouse name: N/A  . Number of children: N/A  . Years of education: N/A   Social History Main Topics  . Smoking status: Current Every Day Smoker    Packs/day: 1.00    Types: Cigarettes  . Smokeless tobacco: Never Used  . Alcohol use No  . Drug use: No  . Sexual activity: Not Asked   Other Topics Concern  . None   Social History Narrative  . None    Past Medical History:  Diagnosis Date  . Arthritis    Rheumatoid and Psoriatic     Patient Active Problem List   Diagnosis Date Noted  . Calculus of kidney 01/16/2015  . H/O anxiety state 01/09/2015  . History of asthma 01/09/2015  . History of migraine headaches 01/09/2015  . Psoriasis 01/09/2015  . 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 APPENDECTOMY    . CESAREAN SECTION     x2  . KNEE ARTHROSCOPY Left     Family History        Family Status  Relation Status  . Mother Alive  . Father Alive  . Brother Alive  . Son Alive  . Maternal Grandmother Deceased  . Maternal Grandfather Deceased  . Paternal Grandfather Deceased  . Son Alive        Her family history includes Arrhythmia in her maternal grandfather; Hypertension in her maternal grandfather; Hypothyroidism in her mother; Lung cancer in her maternal grandmother.     Allergies  Allergen Reactions  . Acetaminophen-Codeine Itching  . Escitalopram   . Hydrocodone-Acetaminophen Nausea Only  . Propoxyphene Hives  . Tramadol Hives     Current Outpatient Prescriptions:  .  albuterol (VENTOLIN HFA) 108 (90 Base) MCG/ACT inhaler, Inhale 2 puffs into the lungs every 6 (six) hours as needed for wheezing or shortness of breath. Reported on 12/26/2015  (Patient not taking: Reported on 11/05/2016), Disp: 1 Inhaler, Rfl: 5 .  cyclobenzaprine (FLEXERIL) 10 MG tablet, Take 1 tablet (10 mg total) by mouth 2 (two) times daily as needed for muscle spasms. (Patient not taking: Reported on 11/05/2016), Disp: 20 tablet, Rfl: 0 .  furosemide (LASIX) 20 MG tablet, Take 1 tablet (20 mg total) by mouth daily., Disp: 14 tablet, Rfl: 0 .  HYDROcodone-acetaminophen (NORCO/VICODIN) 5-325 MG tablet, One ever 4-6 hours for knee pain (Patient not taking: Reported on 11/05/2016), Disp: 20 tablet, Rfl: 0   Patient Care Team: Anola Gurneyhauvin, Robert, [place] as PCP - General (Family Medicine)      Objective:   Vitals: BP 112/68 (BP Location: Left Arm, Patient Position: Sitting, Cuff Size: Large)   Pulse 76   Temp 98.1 F (36.7 C) (Oral)   Resp 16   Ht 5\' 8"  (1.727 m)   Wt 263 lb (119.3 kg)   LMP 11/03/2016   BMI 39.99 kg/m    Vitals:   11/09/16 1516  BP: 112/68  Pulse: 76  Resp: 16  Temp: 98.1 F (36.7 C)  TempSrc: Oral  Weight: 263 lb (119.3 kg)  Height: 5\' 8"  (1.727 m)     Physical Exam  Constitutional: She is oriented to person, place, and time. She appears well-developed and well-nourished.  Cardiovascular: Normal rate and regular rhythm.   Pulmonary/Chest: Effort normal and breath sounds normal.  Abdominal: Soft. Bowel sounds are normal.  Genitourinary: Vagina normal. Rectal exam shows no external hemorrhoid and no fissure. No vaginal discharge found.  Neurological: She is alert and oriented to person, place, and time. She has normal reflexes. No cranial nerve deficit. Coordination and gait normal.  FNF normal  Skin: Skin is warm and dry.  Small scab under left nare.  Psychiatric: She has a normal mood and affect. Her behavior is normal.     Depression Screen PHQ 2/9 Scores 11/09/2016  PHQ - 2 Score 0  PHQ- 9 Score 5      Assessment & Plan:     Routine Health Maintenance and Physical Exam  Exercise Activities and Dietary recommendations Goals     None       There is no immunization history on file for this patient.  Health Maintenance  Topic Date Due  . HIV Screening  09/10/1996  . TETANUS/TDAP  09/10/2000  . PAP SMEAR  09/11/2002  . INFLUENZA VACCINE  02/03/2017     Discussed health benefits of physical activity, and encouraged her to engage in regular exercise appropriate for her age and condition.    1. Annual physical exam  2. Arthropathic psoriasis (HCC)  Seeing [facility] on 11/18/2016.  3. Class 2 obesity without serious comorbidity with body mass index (BMI) of 39.0 to 39.9 in adult, unspecified obesity type  Have gone over most likely causes of weight gain including excess calories. Thyroid panel normal. Swelling improved today, low suspicion for heart failure. Encouraged patient to keep food journal to track her eating habits. I don't prescribe weight loss medications.  - Comprehensive metabolic panel - CBC with Differential/Platelet - Lipid panel  4. Acute non-recurrent maxillary sinusitis  Treat as below, also cover for MRSA. Reviewed culture from 02/2017, positive for MRSA and sensitive to Bactrim.  - sulfamethoxazole-trimethoprim (BACTRIM DS) 800-160 MG tablet; Take 1 tablet by mouth 2 (two) times daily.  Dispense: 20 tablet; Refill: 0  5. Bright red rectal bleeding  Patient not very concerned about this today. No external hemorrhoid on exam today. Will get OC light if persistent.  6. Family history of breast cancer  In her mother at age 26+ and her maternal aunt. Can get mammogram if patient desires, she prefers to wait.   7. Encounter for tobacco use cessation counseling   8. Cervical cancer screening  - Pap IG and HPV (high risk) DNA detection  9. H/O anxiety state  Thinking about medications/counseling. Doesn't want this today.   10. Need for Td vaccine  Updated in office today.  - Td : Tetanus/diphtheria >7yo Preservative  free  11. Clumsiness  I have personally  reviewed the Brain MRI and CT Head from 2016 and they are normal. I do agree with findings. Neuro exam normal, gait normal. Unsure of etiology. Some chronic sinus disease on MRI. Doesn't want flonase. Can try 2nd gen antihistamine.   12. MRSA (methicillin resistant staph aureus) culture positive  See #4  Return in about 6 weeks (around 12/21/2016) for weight management .  The entirety of the information documented in the History of Present Illness, Review of Systems and Physical  Exam were personally obtained by me. Portions of this information were initially documented by Kavin Leech, CMA and reviewed by me for thoroughness and accuracy.   --------------------------------------------------------------------    Trey Sailors, PA-C  [facility]

## 2016-11-09 NOTE — Patient Instructions (Signed)
Health Maintenance, Female Adopting a healthy lifestyle and getting preventive care can go a long way to promote health and wellness. Talk with your health care provider about what schedule of regular examinations is right for you. This is a good chance for you to check in with your provider about disease prevention and staying healthy. In between checkups, there are plenty of things you can do on your own. Experts have done a lot of research about which lifestyle changes and preventive measures are most likely to keep you healthy. Ask your health care provider for more information. Weight and diet Eat a healthy diet  Be sure to include plenty of vegetables, fruits, low-fat dairy products, and lean protein.  Do not eat a lot of foods high in solid fats, added sugars, or salt.  Get regular exercise. This is one of the most important things you can do for your health.  Most adults should exercise for at least 150 minutes each week. The exercise should increase your heart rate and make you sweat (moderate-intensity exercise).  Most adults should also do strengthening exercises at least twice a week. This is in addition to the moderate-intensity exercise. Maintain a healthy weight  Body mass index (BMI) is a measurement that can be used to identify possible weight problems. It estimates body fat based on height and weight. Your health care provider can help determine your BMI and help you achieve or maintain a healthy weight.  For females 76 years of age and older:  A BMI below 18.5 is considered underweight.  A BMI of 18.5 to 24.9 is normal.  A BMI of 25 to 29.9 is considered overweight.  A BMI of 30 and above is considered obese. Watch levels of cholesterol and blood lipids  You should start having your blood tested for lipids and cholesterol at 35 years of age, then have this test every 5 years.  You may need to have your cholesterol levels checked more often if:  Your lipid or  cholesterol levels are high.  You are older than 35 years of age.  You are at high risk for heart disease. Cancer screening Lung Cancer  Lung cancer screening is recommended for adults 64-42 years old who are at high risk for lung cancer because of a history of smoking.  A yearly low-dose CT scan of the lungs is recommended for people who:  Currently smoke.  Have quit within the past 15 years.  Have at least a 30-pack-year history of smoking. A pack year is smoking an average of one pack of cigarettes a day for 1 year.  Yearly screening should continue until it has been 15 years since you quit.  Yearly screening should stop if you develop a health problem that would prevent you from having lung cancer treatment. Breast Cancer  Practice breast self-awareness. This means understanding how your breasts normally appear and feel.  It also means doing regular breast self-exams. Let your health care provider know about any changes, no matter how small.  If you are in your 20s or 30s, you should have a clinical breast exam (CBE) by a health care provider every 1-3 years as part of a regular health exam.  If you are 34 or older, have a CBE every year. Also consider having a breast X-ray (mammogram) every year.  If you have a family history of breast cancer, talk to your health care provider about genetic screening.  If you are at high risk for breast cancer, talk  to your health care provider about having an MRI and a mammogram every year.  Breast cancer gene (BRCA) assessment is recommended for women who have family members with BRCA-related cancers. BRCA-related cancers include:  Breast.  Ovarian.  Tubal.  Peritoneal cancers.  Results of the assessment will determine the need for genetic counseling and BRCA1 and BRCA2 testing. Cervical Cancer  Your health care provider may recommend that you be screened regularly for cancer of the pelvic organs (ovaries, uterus, and vagina).  This screening involves a pelvic examination, including checking for microscopic changes to the surface of your cervix (Pap test). You may be encouraged to have this screening done every 3 years, beginning at age 24.  For women ages 66-65, health care providers may recommend pelvic exams and Pap testing every 3 years, or they may recommend the Pap and pelvic exam, combined with testing for human papilloma virus (HPV), every 5 years. Some types of HPV increase your risk of cervical cancer. Testing for HPV may also be done on women of any age with unclear Pap test results.  Other health care providers may not recommend any screening for nonpregnant women who are considered low risk for pelvic cancer and who do not have symptoms. Ask your health care provider if a screening pelvic exam is right for you.  If you have had past treatment for cervical cancer or a condition that could lead to cancer, you need Pap tests and screening for cancer for at least 20 years after your treatment. If Pap tests have been discontinued, your risk factors (such as having a new sexual partner) need to be reassessed to determine if screening should resume. Some women have medical problems that increase the chance of getting cervical cancer. In these cases, your health care provider may recommend more frequent screening and Pap tests. Colorectal Cancer  This type of cancer can be detected and often prevented.  Routine colorectal cancer screening usually begins at 35 years of age and continues through 35 years of age.  Your health care provider may recommend screening at an earlier age if you have risk factors for colon cancer.  Your health care provider may also recommend using home test kits to check for hidden blood in the stool.  A small camera at the end of a tube can be used to examine your colon directly (sigmoidoscopy or colonoscopy). This is done to check for the earliest forms of colorectal cancer.  Routine  screening usually begins at age 41.  Direct examination of the colon should be repeated every 5-10 years through 35 years of age. However, you may need to be screened more often if early forms of precancerous polyps or small growths are found. Skin Cancer  Check your skin from head to toe regularly.  Tell your health care provider about any new moles or changes in moles, especially if there is a change in a mole's shape or color.  Also tell your health care provider if you have a mole that is larger than the size of a pencil eraser.  Always use sunscreen. Apply sunscreen liberally and repeatedly throughout the day.  Protect yourself by wearing long sleeves, pants, a wide-brimmed hat, and sunglasses whenever you are outside. Heart disease, diabetes, and high blood pressure  High blood pressure causes heart disease and increases the risk of stroke. High blood pressure is more likely to develop in:  People who have blood pressure in the high end of the normal range (130-139/85-89 mm Hg).  People who are overweight or obese.  People who are African American.  If you are 59-24 years of age, have your blood pressure checked every 3-5 years. If you are 34 years of age or older, have your blood pressure checked every year. You should have your blood pressure measured twice-once when you are at a hospital or clinic, and once when you are not at a hospital or clinic. Record the average of the two measurements. To check your blood pressure when you are not at a hospital or clinic, you can use:  An automated blood pressure machine at a pharmacy.  A home blood pressure monitor.  If you are between 29 years and 60 years old, ask your health care provider if you should take aspirin to prevent strokes.  Have regular diabetes screenings. This involves taking a blood sample to check your fasting blood sugar level.  If you are at a normal weight and have a low risk for diabetes, have this test once  every three years after 35 years of age.  If you are overweight and have a high risk for diabetes, consider being tested at a younger age or more often. Preventing infection Hepatitis B  If you have a higher risk for hepatitis B, you should be screened for this virus. You are considered at high risk for hepatitis B if:  You were born in a country where hepatitis B is common. Ask your health care provider which countries are considered high risk.  Your parents were born in a high-risk country, and you have not been immunized against hepatitis B (hepatitis B vaccine).  You have HIV or AIDS.  You use needles to inject street drugs.  You live with someone who has hepatitis B.  You have had sex with someone who has hepatitis B.  You get hemodialysis treatment.  You take certain medicines for conditions, including cancer, organ transplantation, and autoimmune conditions. Hepatitis C  Blood testing is recommended for:  Everyone born from [birth year] through 1964.  Anyone with known risk factors for hepatitis C. Sexually transmitted infections (STIs)  You should be screened for sexually transmitted infections (STIs) including gonorrhea and chlamydia if:  You are sexually active and are younger than 35 years of age.  You are older than 35 years of age and your health care provider tells you that you are at risk for this type of infection.  Your sexual activity has changed since you were last screened and you are at an increased risk for chlamydia or gonorrhea. Ask your health care provider if you are at risk.  If you do not have HIV, but are at risk, it may be recommended that you take a prescription medicine daily to prevent HIV infection. This is called pre-exposure prophylaxis (PrEP). You are considered at risk if:  You are sexually active and do not regularly use condoms or know the HIV status of your partner(s).  You take drugs by injection.  You are sexually active with a partner  who has HIV. Talk with your health care provider about whether you are at high risk of being infected with HIV. If you choose to begin PrEP, you should first be tested for HIV. You should then be tested every 3 months for as long as you are taking PrEP. Pregnancy  If you are premenopausal and you may become pregnant, ask your health care provider about preconception counseling.  If you may become pregnant, take 400 to 800 micrograms (mcg) of folic acid  every day.  If you want to prevent pregnancy, talk to your health care provider about birth control (contraception). Osteoporosis and menopause  Osteoporosis is a disease in which the bones lose minerals and strength with aging. This can result in serious bone fractures. Your risk for osteoporosis can be identified using a bone density scan.  If you are 4 years of age or older, or if you are at risk for osteoporosis and fractures, ask your health care provider if you should be screened.  Ask your health care provider whether you should take a calcium or vitamin D supplement to lower your risk for osteoporosis.  Menopause may have certain physical symptoms and risks.  Hormone replacement therapy may reduce some of these symptoms and risks. Talk to your health care provider about whether hormone replacement therapy is right for you. Follow these instructions at home:  Schedule regular health, dental, and eye exams.  Stay current with your immunizations.  Do not use any tobacco products including cigarettes, chewing tobacco, or electronic cigarettes.  If you are pregnant, do not drink alcohol.  If you are breastfeeding, limit how much and how often you drink alcohol.  Limit alcohol intake to no more than 1 drink per day for nonpregnant women. One drink equals 12 ounces of beer, 5 ounces of wine, or 1 ounces of hard liquor.  Do not use street drugs.  Do not share needles.  Ask your health care provider for help if you need support  or information about quitting drugs.  Tell your health care provider if you often feel depressed.  Tell your health care provider if you have ever been abused or do not feel safe at home. This information is not intended to replace advice given to you by your health care provider. Make sure you discuss any questions you have with your health care provider. Document Released: 01/05/2011 Document Revised: 11/28/2015 Document Reviewed: 03/26/2015 Elsevier Interactive Patient Education  2017 [employer].

## 2016-11-11 ENCOUNTER — Telehealth: Payer: Self-pay

## 2016-11-11 LAB — PAP IG AND HPV HIGH-RISK
HPV, high-risk: NEGATIVE
PAP Smear Comment: 0

## 2016-11-11 LAB — PLEASE NOTE

## 2016-11-11 NOTE — Telephone Encounter (Signed)
Patient has been advised. KW 

## 2016-11-11 NOTE — Telephone Encounter (Signed)
-----   Message from Trey SailorsAdriana M Pollak, [place]PA-C sent at 11/11/2016  9:06 AM EDT ----- PAP/HPV normal. Repeat in 5 years.

## 2016-11-26 ENCOUNTER — Encounter: Payer: Self-pay | Admitting: Family Medicine

## 2016-11-26 ENCOUNTER — Ambulatory Visit
Admission: RE | Admit: 2016-11-26 | Discharge: 2016-11-26 | Disposition: A | Discharge status: Home / Self Care | Payer: 59 | Source: Ambulatory Visit | Attending: Family Medicine | Admitting: Family Medicine

## 2016-11-26 ENCOUNTER — Ambulatory Visit (INDEPENDENT_AMBULATORY_CARE_PROVIDER_SITE_OTHER): Payer: 59 | Admitting: Family Medicine

## 2016-11-26 ENCOUNTER — Other Ambulatory Visit: Payer: Self-pay | Admitting: Family Medicine

## 2016-11-26 ENCOUNTER — Telehealth: Payer: Self-pay

## 2016-11-26 VITALS — BP 126/76 | HR 89 | Temp 98.6°F | Resp 17 | Wt 270.0 lb

## 2016-11-26 DIAGNOSIS — M25522 Pain in left elbow: Secondary | ICD-10-CM | POA: Insufficient documentation

## 2016-11-26 DIAGNOSIS — S59902A Unspecified injury of left elbow, initial encounter: Secondary | ICD-10-CM | POA: Diagnosis not present

## 2016-11-26 MED ORDER — HYDROCODONE-ACETAMINOPHEN 5-325 MG PO TABS: ORAL_TABLET | ORAL | 0 refills | Status: DC

## 2016-11-26 NOTE — Telephone Encounter (Signed)
-----   Message from Anola Gurneyobert Chauvin, [place] sent at 11/26/2016  2:46 PM EDT ----- No fracture. Continue icing, Nsaid's. And pain medication as needed. Gentle range of movement exercises.

## 2016-11-26 NOTE — Progress Notes (Signed)
Subjective:     Patient ID: Allison Moss, female   DOB: 03/12/1981, 36 y.o.   MRN: [id number]  HPI  Chief Complaint  Patient presents with  . Fall    Patient comes in office today with complaints of arm pain and knee pain since 5/22. Patient reports that it was raining outside when she was walking on gravel and fell hitting gravel and concrete. Patient states that she landed on hr knees and arms scrapped across concrete. Patient is unable to move or extend left arm and states that it is painful to touch.    States she believes she bruised her right elbow and knees but is splinting her left elbow. Has been using ice and ibuprofen but elbow not getting better. Reports she was going down a ramp to their driveway ending in 4 stepping stones. Unsure whether she tripped or slipped. Did not pass out. States she is unable to use that arm to key at work.   Review of Systems     Objective:   Physical Exam  Constitutional: She appears well-developed and well-nourished. No distress.  Musculoskeletal:  Able to flex and extend right elbow and knees without difficulty. Minimal tenderness on palpation. Left elbow tender over her lateral epicondyle/distal humerus. Patient does not wish to move due to pain.       Assessment:    1. Left elbow pain: r/o fx - DG Elbow Complete Left; Future - HYDROcodone-acetaminophen (NORCO/VICODIN) 5-325 MG tablet; One every 4-6 hours as needed for pain  Dispense: 20 tablet; Refill: 0    Plan:    Continue ibuprofen and icing pending x-ray result. Work excuse provided for 5/24-5/25/18.

## 2016-11-26 NOTE — Patient Instructions (Signed)
Continue ibuprofen and icing. We will call you with the x-ray report.

## 2016-11-26 NOTE — Telephone Encounter (Signed)
Pt.Advised. KW 

## 2016-12-04 DIAGNOSIS — M2242 Chondromalacia patellae, left knee: Secondary | ICD-10-CM | POA: Diagnosis not present

## 2016-12-04 DIAGNOSIS — M1711 Unilateral primary osteoarthritis, right knee: Secondary | ICD-10-CM | POA: Diagnosis not present

## 2016-12-04 DIAGNOSIS — Z4789 Encounter for other orthopedic aftercare: Secondary | ICD-10-CM | POA: Diagnosis not present

## 2016-12-04 DIAGNOSIS — S83241D Other tear of medial meniscus, current injury, right knee, subsequent encounter: Secondary | ICD-10-CM | POA: Diagnosis not present

## 2016-12-08 ENCOUNTER — Other Ambulatory Visit: Payer: Self-pay | Admitting: Family Medicine

## 2016-12-08 ENCOUNTER — Telehealth: Payer: Self-pay | Admitting: Emergency Medicine

## 2016-12-08 DIAGNOSIS — L4050 Arthropathic psoriasis, unspecified: Secondary | ICD-10-CM

## 2016-12-08 NOTE — Telephone Encounter (Signed)
Please review. KW 

## 2016-12-08 NOTE — Telephone Encounter (Signed)
Pt called stating that [facility] at the end of last year and still has not gotten an appointment to see anyone. She would like to be referred to a different place. [facility] Dr. Dierdre ForthBeekman. Please advise. Thanks.

## 2016-12-08 NOTE — Telephone Encounter (Signed)
Referral in progress. 

## 2016-12-21 ENCOUNTER — Ambulatory Visit: Payer: 59 | Admitting: Physician Assistant

## 2017-08-04 ENCOUNTER — Encounter: Payer: Self-pay | Admitting: Emergency Medicine

## 2017-08-04 ENCOUNTER — Emergency Department
Admission: EM | Admit: 2017-08-04 | Discharge: 2017-08-04 | Disposition: A | Discharge status: Home / Self Care | Payer: 59 | Attending: Emergency Medicine | Admitting: Emergency Medicine

## 2017-08-04 ENCOUNTER — Other Ambulatory Visit: Payer: Self-pay

## 2017-08-04 DIAGNOSIS — F17210 Nicotine dependence, cigarettes, uncomplicated: Secondary | ICD-10-CM | POA: Insufficient documentation

## 2017-08-04 DIAGNOSIS — M436 Torticollis: Secondary | ICD-10-CM | POA: Insufficient documentation

## 2017-08-04 DIAGNOSIS — Z79899 Other long term (current) drug therapy: Secondary | ICD-10-CM | POA: Insufficient documentation

## 2017-08-04 MED ORDER — KETOROLAC TROMETHAMINE 10 MG PO TABS: 10.0000 mg | ORAL_TABLET | Freq: Four times a day (QID) | ORAL | 0 refills | Status: AC | PRN

## 2017-08-04 MED ORDER — KETOROLAC TROMETHAMINE 30 MG/ML IJ SOLN
30.0000 mg | Freq: Once | INTRAMUSCULAR | Status: AC
  Administered 2017-08-04: 30 mg via INTRAMUSCULAR
  Filled 2017-08-04: qty 1

## 2017-08-04 MED ORDER — ORPHENADRINE CITRATE 30 MG/ML IJ SOLN
60.0000 mg | Freq: Two times a day (BID) | INTRAMUSCULAR | Status: DC
  Administered 2017-08-04: 60 mg via INTRAMUSCULAR
  Filled 2017-08-04: qty 2

## 2017-08-04 MED ORDER — ORPHENADRINE CITRATE ER 100 MG PO TB12: 100.0000 mg | ORAL_TABLET | Freq: Two times a day (BID) | ORAL | 1 refills | Status: AC | PRN

## 2017-08-04 NOTE — ED Provider Notes (Signed)
[facility] Emergency Department Provider Note  ____________________________________________  Time seen: Approximately 5:09 PM  I have reviewed the triage vital signs and the nursing notes.   HISTORY  Chief Complaint Shoulder Pain    HPI Allison Moss is a 37 y.o. female presents to the emergency department with right shoulder and neck spasms that started this morning.  Patient reportedly woke up with neck stiffness in the absence of trauma.  Patient has had a recent cold.  She denies fever.  Patient can perform limited lateral rotation and flexion and extension at the neck. Patient has taken Norco.    Past Medical History:  Diagnosis Date  . Arthritis    Rheumatoid and Psoriatic    Patient Active Problem List   Diagnosis Date Noted  . Calculus of kidney 01/16/2015  . H/O anxiety state 01/09/2015  . History of asthma 01/09/2015  . History of migraine headaches 01/09/2015  . Psoriasis 01/09/2015  . 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 APPENDECTOMY    . CESAREAN SECTION     x2  . KNEE ARTHROSCOPY Left     Prior to Admission medications   Medication Sig Start Date End Date Taking? Authorizing Provider  albuterol (VENTOLIN HFA) 108 (90 Base) MCG/ACT inhaler Inhale 2 puffs into the lungs every 6 (six) hours as needed for wheezing or shortness of breath. Reported on 12/26/2015 03/03/16   Anola Gurney, PA  furosemide (LASIX) 20 MG tablet Take 1 tablet (20 mg total) by mouth daily. 11/05/16   Anola Gurney, PA  HYDROcodone-acetaminophen (NORCO/VICODIN) 5-325 MG tablet One every 4-6 hours as needed for pain 11/26/16   Anola Gurney, PA  ketorolac (TORADOL) 10 MG tablet Take 1 tablet (10 mg total) by mouth every 6 (six) hours as needed for up to 5 days. 08/04/17 08/09/17  Orvil Feil, PA-C  orphenadrine (NORFLEX) 100 MG tablet Take 1 tablet (100 mg total) by mouth 2 (two) times daily as  needed for up to 7 days for muscle spasms. 08/04/17 08/11/17  Orvil Feil, PA-C    Allergies Acetaminophen-codeine; Escitalopram; Hydrocodone-acetaminophen; Propoxyphene; and Tramadol  Family History  Problem Relation Age of Onset  . Hypothyroidism Mother   . Lung cancer Maternal Grandmother   . Hypertension Maternal Grandfather   . Arrhythmia Maternal Grandfather     Social History Social History   Tobacco Use  . Smoking status: Current Every Day Smoker    Packs/day: 1.00    Types: Cigarettes  . Smokeless tobacco: Never Used  Substance Use Topics  . Alcohol use: No    Alcohol/week: 0.0 oz  . Drug use: No     Review of Systems  Constitutional: No fever/chills Eyes: No visual changes. No discharge ENT: No upper respiratory complaints. Cardiovascular: no chest pain. Respiratory: no cough. No SOB. Musculoskeletal: Patient has right shoulder and neck pain.  Skin: Negative for rash, abrasions, lacerations, ecchymosis. Neurological: Negative for headaches, focal weakness or numbness.   ____________________________________________   PHYSICAL EXAM:  VITAL SIGNS: ED Triage Vitals  Enc Vitals Group     BP 08/04/17 1633 127/65     Pulse Rate 08/04/17 1633 85     Resp 08/04/17 1633 18     Temp 08/04/17 1633 98.4 F (36.9 C)     Temp Source 08/04/17 1633 Oral     SpO2 08/04/17 1633 98 %     Weight 08/04/17 1634 240 lb (108.9 kg)  Height 08/04/17 1634 5\' 9"  (1.753 m)     Head Circumference --      Peak Flow --      Pain Score 08/04/17 1634 10     Pain Loc --      Pain Edu? --      Excl. in GC? --      Constitutional: Alert and oriented. Well appearing and in no acute distress. Eyes: Conjunctivae are normal. PERRL. EOMI. Head: Atraumatic. Cardiovascular: Normal rate, regular rhythm. Normal S1 and S2.  Good peripheral circulation. Respiratory: Normal respiratory effort without tachypnea or retractions. Lungs CTAB. Good air entry to the bases with no decreased  or absent breath sounds. Musculoskeletal: Patient was able to perform limited range of motion at the neck, likely secondary to pain.  Full range of motion was performed at the right shoulder.  No weakness or pain was elicited with right rotator cuff testing.  Trapezius and paraspinal cervical spine muscle tenderness was elicited. Neurologic:  Normal speech and language. No gross focal neurologic deficits are appreciated.  Skin:  Skin is warm, dry and intact. No rash noted.  ____________________________________________   LABS (all labs ordered are listed, but only abnormal results are displayed)  Labs Reviewed - No data to display ____________________________________________  EKG   ____________________________________________  RADIOLOGY   No results found.  ____________________________________________    PROCEDURES  Procedure(s) performed:    Procedures    Medications  orphenadrine (NORFLEX) injection 60 mg (60 mg Intramuscular Given 08/04/17 1733)  ketorolac (TORADOL) 30 MG/ML injection 30 mg (30 mg Intramuscular Given 08/04/17 1733)     ____________________________________________   INITIAL IMPRESSION / ASSESSMENT AND PLAN / ED COURSE  Pertinent labs & imaging results that were available during my care of the patient were reviewed by me and considered in my medical decision making (see chart for details).  Review of the [place] CSRS was performed in accordance of the [facility] prior to dispensing any controlled drugs.     Assessment and Plan: Torticollis Patient presents to the emergency department with neck pain and stiffness that started today without provoking trauma.  Differential diagnosis included torticollis versus idiopathic muscle spasm.  Patient's pain improved significantly with administered Norflex and Toradol.  Patient was discharged with Norflex and Toradol.  Vital signs are reassuring prior to discharge.  All patient questions were  answered.   ____________________________________________  FINAL CLINICAL IMPRESSION(S) / ED DIAGNOSES  Final diagnoses:  Torticollis      NEW MEDICATIONS STARTED DURING THIS VISIT:  ED Discharge Orders        Ordered    ketorolac (TORADOL) 10 MG tablet  Every 6 hours PRN     08/04/17 1816    orphenadrine (NORFLEX) 100 MG tablet  2 times daily PRN     08/04/17 1816          This chart was dictated using voice recognition software/Dragon. Despite best efforts to proofread, errors can occur which can change the meaning. Any change was purely unintentional.    Orvil FeilWoods, Tarah Buboltz M, PA-C 08/04/17 1826    Myrna BlazerSchaevitz, David Matthew, MD 08/06/17 [phone]

## 2017-08-04 NOTE — ED Notes (Signed)
Pt c/o waking with neck shoulder and upper back pain/stiffness. Denies injury, denies any lifting yesterday. Denies any fever. Pt has thermapatch on for comfort from home. Pt states she took hydrocodone around 930am..

## 2017-08-04 NOTE — ED Triage Notes (Signed)
C/o pain to right neck and shoulder starting this morning.  Pain with any movement of neck.  Scapula muscle feels tight on exam. Ambulatory. VSS. NAD

## 2017-08-05 ENCOUNTER — Encounter: Payer: Self-pay | Admitting: Emergency Medicine

## 2017-08-05 ENCOUNTER — Emergency Department: Payer: Self-pay

## 2017-08-05 ENCOUNTER — Emergency Department
Admission: EM | Admit: 2017-08-05 | Discharge: 2017-08-05 | Disposition: A | Discharge status: Home / Self Care | Payer: Self-pay | Attending: Emergency Medicine | Admitting: Emergency Medicine

## 2017-08-05 DIAGNOSIS — J45909 Unspecified asthma, uncomplicated: Secondary | ICD-10-CM | POA: Insufficient documentation

## 2017-08-05 DIAGNOSIS — F17210 Nicotine dependence, cigarettes, uncomplicated: Secondary | ICD-10-CM | POA: Insufficient documentation

## 2017-08-05 DIAGNOSIS — M25522 Pain in left elbow: Secondary | ICD-10-CM

## 2017-08-05 DIAGNOSIS — Z79899 Other long term (current) drug therapy: Secondary | ICD-10-CM | POA: Insufficient documentation

## 2017-08-05 DIAGNOSIS — M436 Torticollis: Secondary | ICD-10-CM | POA: Insufficient documentation

## 2017-08-05 MED ORDER — OXYCODONE-ACETAMINOPHEN 5-325 MG PO TABS: 1.0000 | ORAL_TABLET | Freq: Four times a day (QID) | ORAL | 0 refills | Status: DC | PRN

## 2017-08-05 MED ORDER — OXYCODONE-ACETAMINOPHEN 7.5-325 MG PO TABS
1.0000 | ORAL_TABLET | Freq: Once | ORAL | Status: AC
  Administered 2017-08-05: 1 via ORAL
  Filled 2017-08-05: qty 1

## 2017-08-05 NOTE — ED Notes (Addendum)
See triage note.  States she was seen last pm with neck pain  States she had min relief with Toradol injection but pain returned. Pain goes from post neck into mid back and right shoulder unsure of injury

## 2017-08-05 NOTE — ED Provider Notes (Signed)
[facility] Emergency Department Provider Note  ____________________________________________   First MD Initiated Contact with Patient 08/05/17 1120     (approximate)  I have reviewed the triage vital signs and the nursing notes.   HISTORY  Chief Complaint Neck Pain   HPI Allison Moss is a 37 y.o. female is here with complaint of neck pain.  Patient states that she was seen here yesterday for the same.  Patient states that yesterday she was given an injection which did help for approximately 45 minutes.  She states that the prescriptions that she had field are not helping with her neck pain today.  She states there is been no injury or trauma.  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  She states that she woke with neck pain yesterday.  Patient rates her pain as 10/10.   Past Medical History:  Diagnosis Date  . Arthritis    Rheumatoid and Psoriatic    Patient Active Problem List   Diagnosis Date Noted  . Calculus of kidney 01/16/2015  . H/O anxiety state 01/09/2015  . History of asthma 01/09/2015  . History of migraine headaches 01/09/2015  . Psoriasis 01/09/2015  . 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 APPENDECTOMY    . CESAREAN SECTION     x2  . KNEE ARTHROSCOPY Left     Prior to Admission medications   Medication Sig Start Date End Date Taking? Authorizing Provider  orphenadrine (NORFLEX) 100 MG tablet Take 100 mg by mouth 2 (two) times daily.   Yes [provider]  furosemide (LASIX) 20 MG tablet Take 1 tablet (20 mg total) by mouth daily. 11/05/16   Anola Gurneyhauvin, Robert, PA  ketorolac (TORADOL) 10 MG tablet Take 1 tablet (10 mg total) by mouth every 6 (six) hours as needed for up to 5 days. 08/04/17 08/09/17  Orvil FeilWoods, Jaclyn M, PA-C  orphenadrine (NORFLEX) 100 MG tablet Take 1 tablet (100 mg total) by mouth 2 (two) times daily as needed  for up to 7 days for muscle spasms. 08/04/17 08/11/17  Orvil FeilWoods, Jaclyn M, PA-C  oxyCODONE-acetaminophen (PERCOCET) 5-325 MG tablet Take 1 tablet by mouth every 6 (six) hours as needed for severe pain. 08/05/17   Tommi RumpsSummers, Rhonda L, PA-C    Allergies Acetaminophen-codeine; Escitalopram; Hydrocodone-acetaminophen; Propoxyphene; and Tramadol  Family History  Problem Relation Age of Onset  . Hypothyroidism Mother   . Lung cancer Maternal Grandmother   . Hypertension Maternal Grandfather   . Arrhythmia Maternal Grandfather     Social History Social History   Tobacco Use  . Smoking status: Current Every Day Smoker    Packs/day: 1.00    Types: Cigarettes  . Smokeless tobacco: Never Used  Substance Use Topics  . Alcohol use: No    Alcohol/week: 0.0 oz  . Drug use: No    Review of Systems Constitutional: No fever/chills Eyes: No visual changes. ENT: No sore throat. Cardiovascular: Denies chest pain. Respiratory: Denies shortness of breath. Gastrointestinal: No abdominal pain.  No nausea, no vomiting.  Musculoskeletal: Positive for cervical pain. Skin: Negative for rash. Neurological: Negative for headaches, focal weakness or numbness. ____________________________________________   PHYSICAL EXAM:  VITAL SIGNS: ED Triage Vitals  Enc Vitals Group     BP 08/05/17 1022 132/81     Pulse Rate 08/05/17 1022 78     Resp 08/05/17 1022 17     Temp 08/05/17  1022 98.3 F (36.8 C)     Temp Source 08/05/17 1022 Oral     SpO2 08/05/17 1022 100 %     Weight 08/05/17 1023 245 lb (111.1 kg)     Height 08/05/17 1023 5\' 9"  (1.753 m)     Head Circumference --      Peak Flow --      Pain Score 08/05/17 1023 10     Pain Loc --      Pain Edu? --      Excl. in GC? --    Constitutional: Alert and oriented. Well appearing and in no acute distress. Eyes: Conjunctivae are normal.  Head: Atraumatic. Nose: No congestion/rhinnorhea.  EACs and TMs are clear bilaterally. Mouth/Throat: Mucous  membranes are moist.  Oropharynx non-erythematous. Neck: No stridor.  No point tenderness on palpation of cervical spine however there is moderate tenderness right trapezius and cervical muscles.  Range of motion is restricted secondary to discomfort.  Patient has limited flexion and extension along with lateral movement.  Upper extremities are unrestricted with range of motion.  Skin is intact.  No ecchymosis or abrasions are seen. Cardiovascular: Normal rate, regular rhythm. Grossly normal heart sounds.  Good peripheral circulation. Respiratory: Normal respiratory effort.  No retractions. Lungs CTAB. Musculoskeletal: No lower extremity tenderness nor edema.  No joint effusions. Neurologic:  Normal speech and language. No gross focal neurologic deficits are appreciated.  Skin:  Skin is warm, dry and intact.  Psychiatric: Mood and affect are normal. Speech and behavior are normal.  ____________________________________________   LABS (all labs ordered are listed, but only abnormal results are displayed)  Labs Reviewed - No data to display RADIOLOGY  ED MD interpretation:  No obvious deformity.  Official radiology report(s): Dg Cervical Spine 2-3 Views  Result Date: 08/05/2017 CLINICAL DATA:  Neck pain for 2 days EXAM: CERVICAL SPINE - 2-3 VIEW COMPARISON:  None. FINDINGS: Seven cervical segments are well visualized. Vertebral body height is well maintained. No anterolisthesis is noted. No soft tissue changes are seen. The odontoid is within normal limits. No fracture is noted. IMPRESSION: No acute abnormality noted. Electronically Signed   By: Alcide Clever M.D.   On: 08/05/2017 12:11    ____________________________________________   PROCEDURES  Procedure(s) performed: None  Procedures  Critical Care performed: No  ____________________________________________   INITIAL IMPRESSION / ASSESSMENT AND PLAN / ED COURSE Patient was given Percocet 7.5 mg prior to x-ray.  X-ray results  were negative for acute injury.  Patient was feeling much better with the Percocet 7.5.  Patient is to continue taking muscle relaxants as prescribed yesterday along with the anti-inflammatory.  She is given a prescription for Percocet 5 mg 1 every 6 hours as needed for moderate to severe pain.  Patient is aware that she cannot drive or operate machinery while taking this medication.  She is encouraged to use ice or heat to her neck as needed for discomfort.  She will follow-up with her PCP if any continued problems.  ____________________________________________   FINAL CLINICAL IMPRESSION(S) / ED DIAGNOSES  Final diagnoses:  Torticollis     ED Discharge Orders        Ordered    oxyCODONE-acetaminophen (PERCOCET) 5-325 MG tablet  Every 6 hours PRN     08/05/17 1252       Note:  This document was prepared using Dragon voice recognition software and may include unintentional dictation errors.    Tommi Rumps, PA-C 08/05/17 1312    Paduchowski,  Caryn Bee, MD 08/05/17 1407

## 2017-08-05 NOTE — ED Triage Notes (Signed)
Patient presents to ED via POV from home with c/o neck pain. Patient states she was seen here yesterday but the medication she was sent home on is not helping. Denies any injury or trauma. Even and non labored respirations noted.

## 2017-08-05 NOTE — Discharge Instructions (Signed)
Follow-up with your primary care provider if any continued problems.  Use moist heat or ice to your neck as needed for discomfort.  Continue medications that were written for you yesterday.  Also take Percocet 1 every 6 hours as needed for severe pain.  Do not drive while taking pain medication or muscle relaxant.

## 2017-11-23 ENCOUNTER — Ambulatory Visit (INDEPENDENT_AMBULATORY_CARE_PROVIDER_SITE_OTHER): Payer: Medicaid Other | Admitting: Family Medicine

## 2017-11-23 ENCOUNTER — Other Ambulatory Visit: Payer: Self-pay | Admitting: Family Medicine

## 2017-11-23 ENCOUNTER — Encounter: Payer: Self-pay | Admitting: Family Medicine

## 2017-11-23 VITALS — BP 110/60 | HR 83 | Temp 97.7°F | Resp 16 | Ht 68.5 in | Wt 242.0 lb

## 2017-11-23 DIAGNOSIS — Z87898 Personal history of other specified conditions: Secondary | ICD-10-CM | POA: Diagnosis not present

## 2017-11-23 DIAGNOSIS — F439 Reaction to severe stress, unspecified: Secondary | ICD-10-CM

## 2017-11-23 DIAGNOSIS — Z9189 Other specified personal risk factors, not elsewhere classified: Secondary | ICD-10-CM | POA: Diagnosis not present

## 2017-11-23 MED ORDER — SERTRALINE HCL 50 MG PO TABS: 50.0000 mg | ORAL_TABLET | Freq: Every day | ORAL | 3 refills | Status: DC

## 2017-11-23 NOTE — Patient Instructions (Signed)
We will call you with the lab results. Phone follow up in 2-3 weeks. If you can't tolerate the medication call me.

## 2017-11-23 NOTE — Progress Notes (Signed)
  Subjective:     Patient ID: Allison S BoleyElita QuickB: [birth year], 37 y.o.   MRN: [id number] Chief Complaint  Patient presents with  . Anxiety    OCD, counseling   . Blood Sugar Problem    HA, sweating,    HPI States he is currently undergoing pastoral marital counseling weekly with her husband. Reports that she has a lot of irritability but no depression. 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 Currently works from 6-10:30 PM. It is in the early evening when she will have spells of shakiness and dizziness which resolve by drinking a soda. Her symptoms have coincided with the start of her new business: "I get upset when things are done to how I want them done." Not currently on medication for psoriasis. Review of Systems     Objective:   Physical Exam  Constitutional: She appears well-nourished. No distress.  Cardiovascular: Normal rate and regular rhythm.  Pulmonary/Chest: Breath sounds normal.  Psychiatric:  Tearful at times but otherwise behavior is normal.       Assessment:    1. Situational stress: continue counseling; start sertraline.  2. H/O anxiety state  3. H/O dizziness - CBC with Differential/Platelet - Hemoglobin A1c - Renal function panel    Plan:    Further f/u pending lab work. Phone f/u in 2-3 weeks or sooner if not tolerating medication (reports hives with escitalopram years ago)

## 2017-11-24 LAB — RENAL FUNCTION PANEL
Albumin: 4.3 g/dL (ref 3.5–5.5)
BUN/Creatinine Ratio: 24 — ABNORMAL HIGH (ref 9–23)
BUN: 17 mg/dL (ref 6–20)
CO2: 21 mmol/L (ref 20–29)
Calcium: 9.3 mg/dL (ref 8.7–10.2)
Chloride: 107 mmol/L — ABNORMAL HIGH (ref 96–106)
Creatinine, Ser: 0.72 mg/dL (ref 0.57–1.00)
GFR calc Af Amer: 125 mL/min/1.73
GFR calc non Af Amer: 108 mL/min/1.73
Glucose: 86 mg/dL (ref 65–99)
Phosphorus: 2.5 mg/dL (ref 2.5–4.5)
Potassium: 4.4 mmol/L (ref 3.5–5.2)
Sodium: 142 mmol/L (ref 134–144)

## 2017-11-24 LAB — CBC WITH DIFFERENTIAL/PLATELET
Basophils Absolute: 0 x10E3/uL (ref 0.0–0.2)
Basos: 0 %
EOS (ABSOLUTE): 0.1 x10E3/uL (ref 0.0–0.4)
Eos: 1 %
Hematocrit: 35.2 % (ref 34.0–46.6)
Hemoglobin: 12.2 g/dL (ref 11.1–15.9)
Immature Grans (Abs): 0 x10E3/uL (ref 0.0–0.1)
Immature Granulocytes: 0 %
Lymphocytes Absolute: 1.5 x10E3/uL (ref 0.7–3.1)
Lymphs: 25 %
MCH: 31.1 pg (ref 26.6–33.0)
MCHC: 34.7 g/dL (ref 31.5–35.7)
MCV: 90 fL (ref 79–97)
Monocytes Absolute: 0.3 x10E3/uL (ref 0.1–0.9)
Monocytes: 5 %
Neutrophils Absolute: 4 x10E3/uL (ref 1.4–7.0)
Neutrophils: 69 %
Platelets: 245 x10E3/uL (ref 150–450)
RBC: 3.92 x10E6/uL (ref 3.77–5.28)
RDW: 13.9 % (ref 12.3–15.4)
WBC: 5.9 x10E3/uL (ref 3.4–10.8)

## 2017-11-24 LAB — HEMOGLOBIN A1C
Est. average glucose Bld gHb Est-mCnc: 103 mg/dL
Hgb A1c MFr Bld: 5.2 % (ref 4.8–5.6)

## 2017-12-31 ENCOUNTER — Other Ambulatory Visit: Payer: Self-pay | Admitting: Family Medicine

## 2017-12-31 ENCOUNTER — Telehealth: Payer: Self-pay | Admitting: Family Medicine

## 2017-12-31 NOTE — Telephone Encounter (Signed)
Pt wants to let you know that the Zoloft is working great.  She wants to know what kind of pain medication OTC she can take with the Zoloft for her arthritis  Pt's call back is [phone]  Thanks teri

## 2017-12-31 NOTE — Telephone Encounter (Signed)
Should not be a problem with nsaid's or tylenoll

## 2017-12-31 NOTE — Telephone Encounter (Signed)
Please advise. KW 

## 2017-12-31 NOTE — Telephone Encounter (Signed)
Patient advised.KW 

## 2018-02-08 IMAGING — CR DG ELBOW COMPLETE 3+V*L*
1 series · 4 of 4 positions shown · non-contrast
Comparison: None.

CLINICAL DATA: Acute left elbow pain following fall 2 days ago.
Initial encounter.

EXAM:
LEFT ELBOW - COMPLETE 3+ VIEW

[Series 1: dg elbow complete left (3+view) · 0.14mm/px · 4 of 4 slices shown]
[im 1/4]
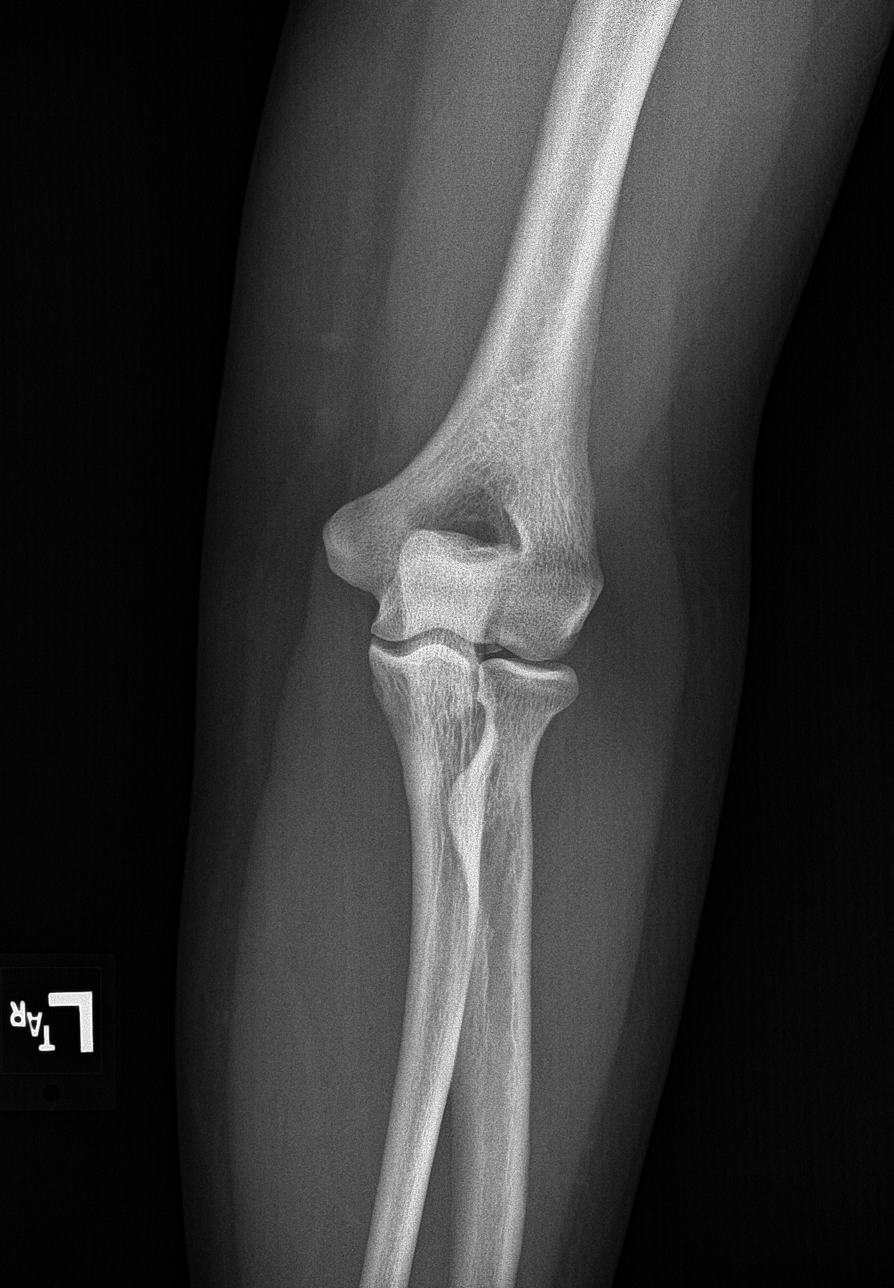
[im 2/4]
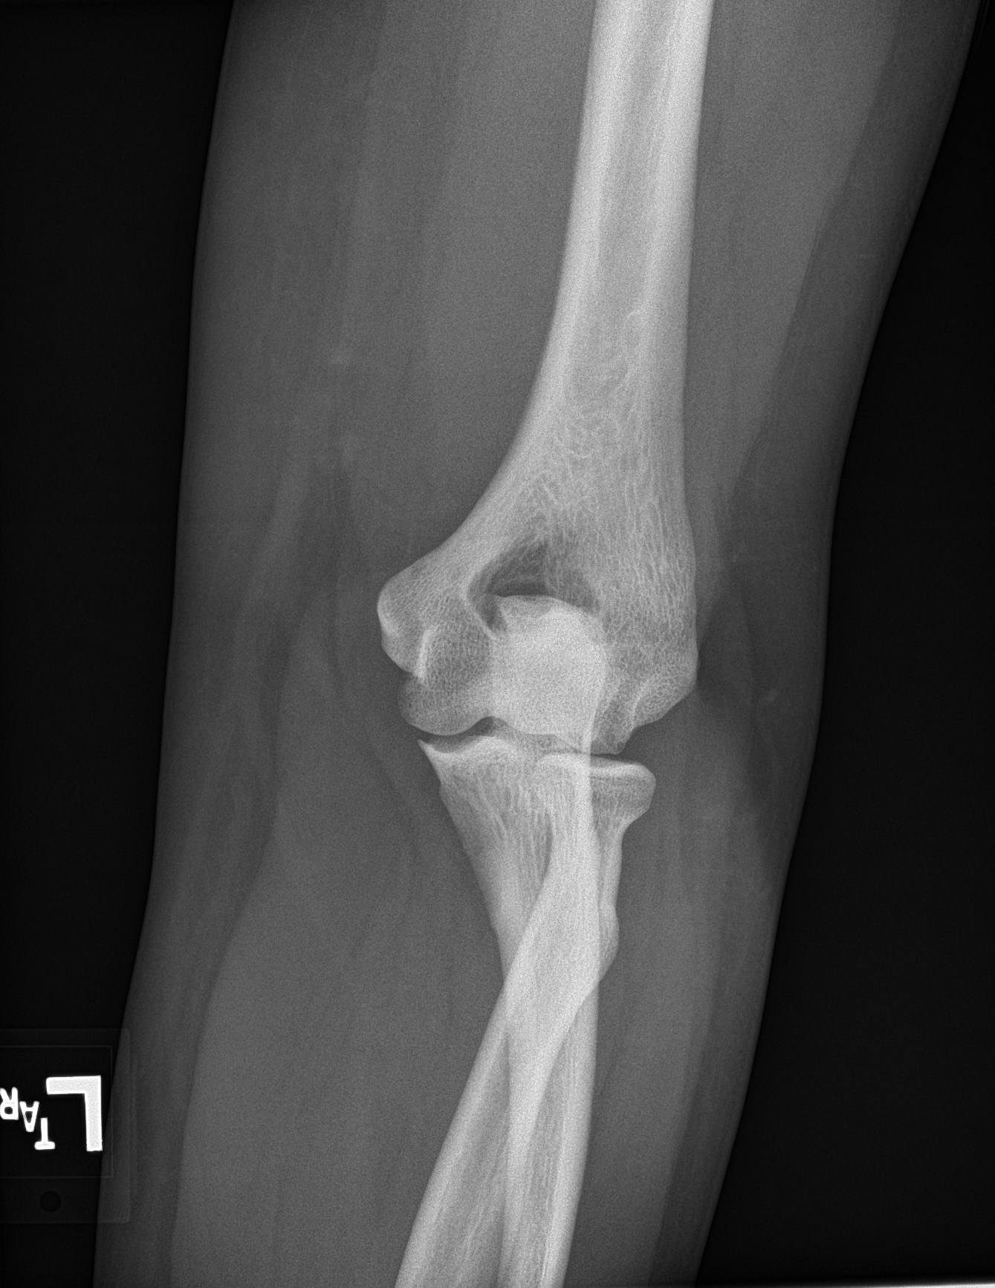
[im 3/4]
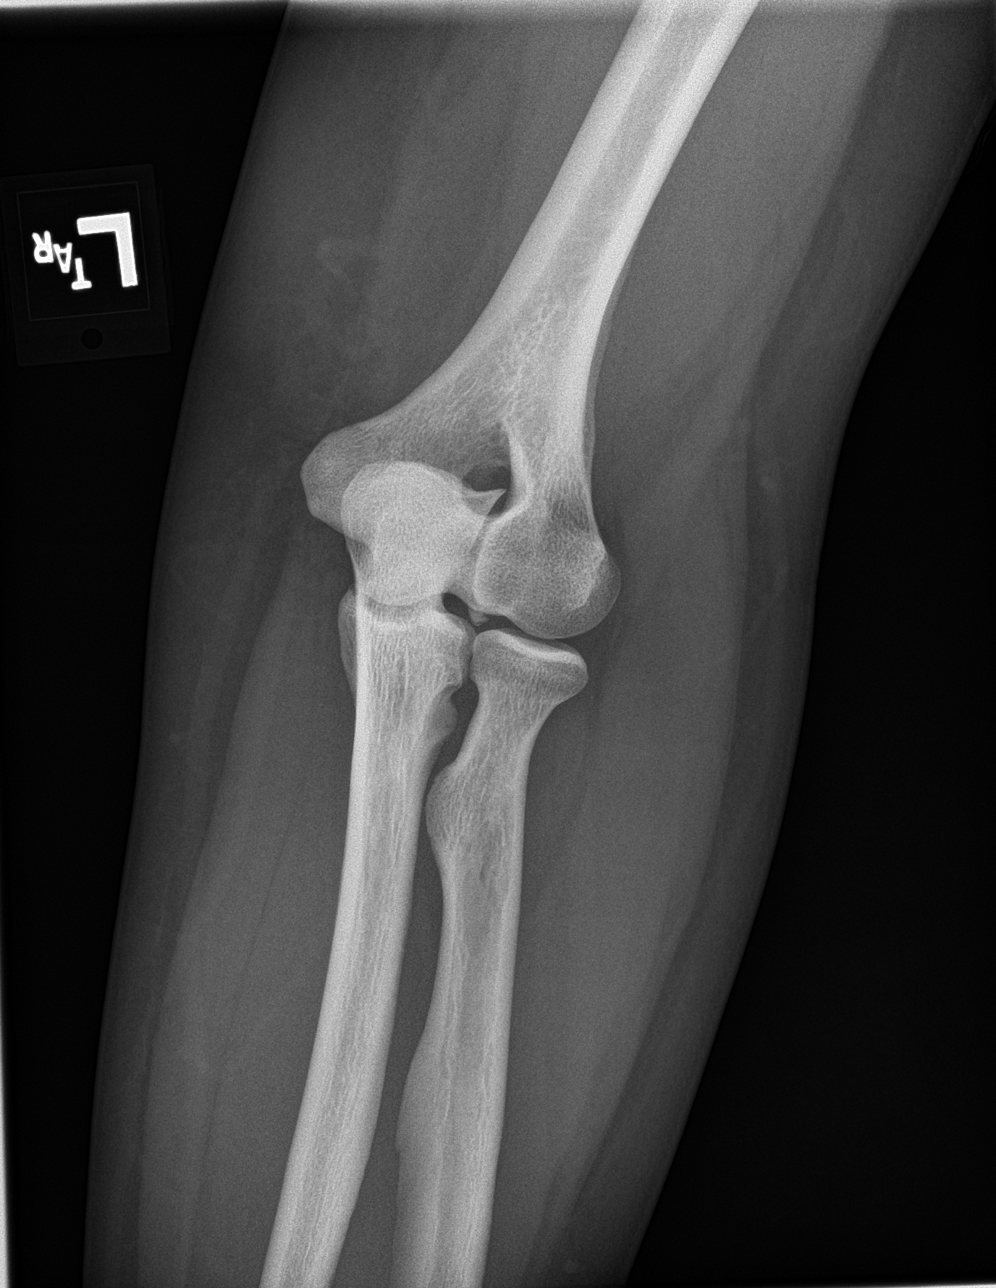
[im 4/4]
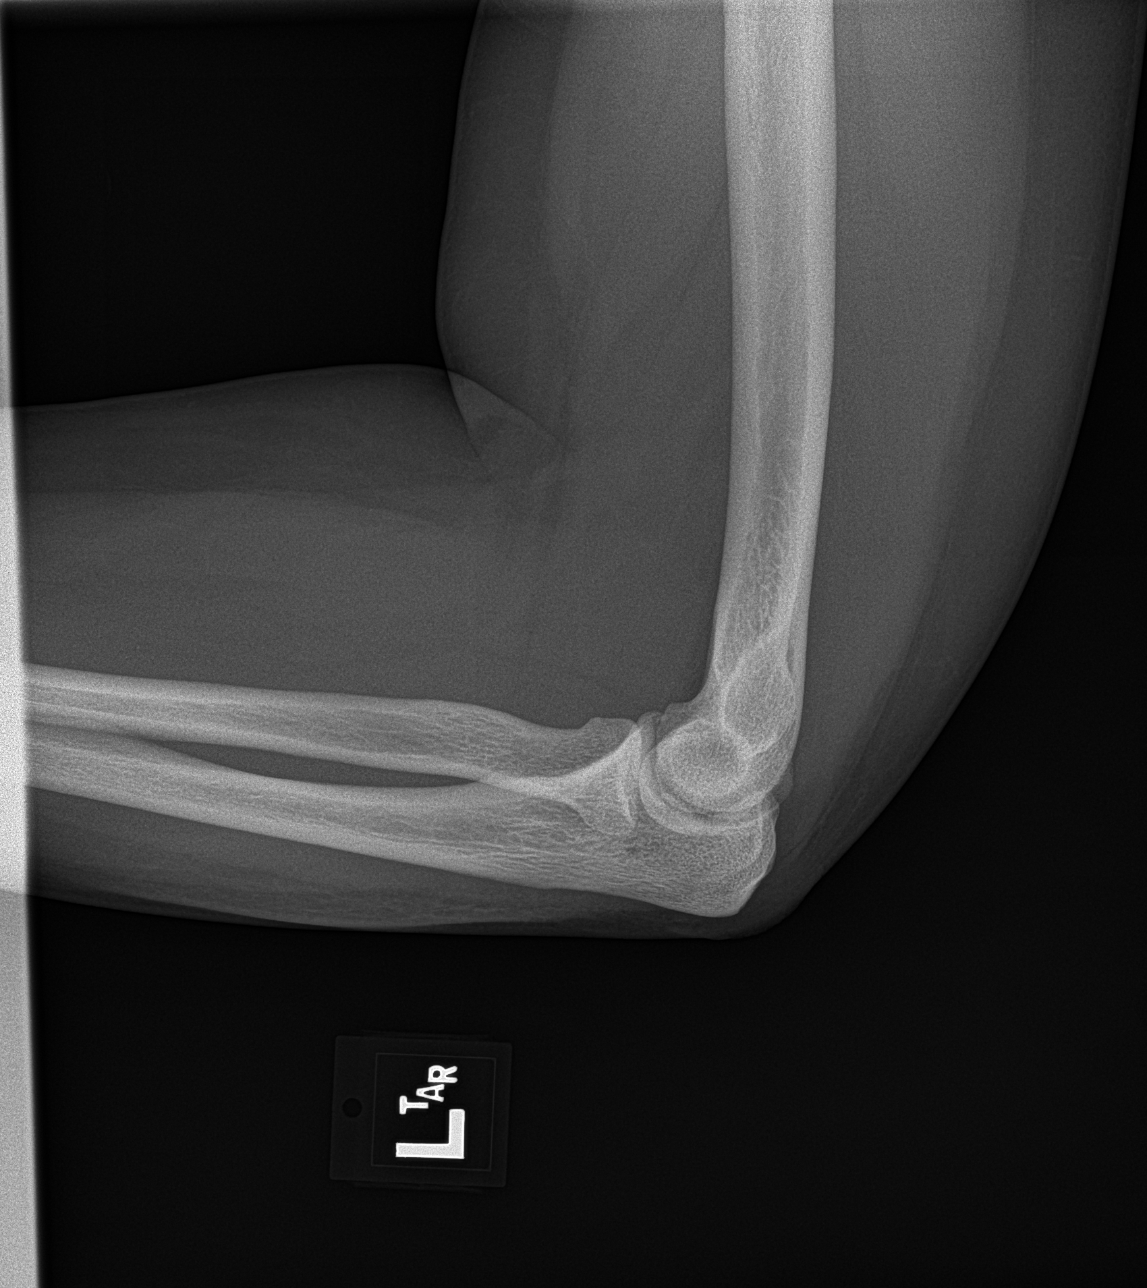

[4 of 4 positions shown; findings below may reference images not displayed]

FINDINGS: No acute fracture, subluxation, dislocation or joint effusion noted.

A small bony density just above the medial aspect of the radial head
is compatible with a normal variant.

No other bony abnormalities are noted.
IMPRESSION: No acute abnormalities.

## 2018-04-11 ENCOUNTER — Ambulatory Visit
Admission: RE | Admit: 2018-04-11 | Discharge: 2018-04-11 | Disposition: A | Discharge status: Home / Self Care | Payer: Medicaid Other | Source: Ambulatory Visit | Attending: Family Medicine | Admitting: Family Medicine

## 2018-04-11 ENCOUNTER — Encounter: Payer: Self-pay | Admitting: Family Medicine

## 2018-04-11 ENCOUNTER — Ambulatory Visit (INDEPENDENT_AMBULATORY_CARE_PROVIDER_SITE_OTHER): Payer: Medicaid Other | Admitting: Family Medicine

## 2018-04-11 VITALS — BP 110/76 | HR 76 | Temp 98.5°F | Resp 16 | Wt 244.0 lb

## 2018-04-11 DIAGNOSIS — M898X1 Other specified disorders of bone, shoulder: Secondary | ICD-10-CM | POA: Insufficient documentation

## 2018-04-11 DIAGNOSIS — M25512 Pain in left shoulder: Secondary | ICD-10-CM | POA: Insufficient documentation

## 2018-04-11 MED ORDER — OXYCODONE-ACETAMINOPHEN 5-325 MG PO TABS: 1.0000 | ORAL_TABLET | Freq: Four times a day (QID) | ORAL | 0 refills | Status: DC | PRN

## 2018-04-11 NOTE — Patient Instructions (Signed)
Continue ibuprofen and warm compresses. We will call you with the x-ray result.

## 2018-04-11 NOTE — Progress Notes (Signed)
  Subjective:     Patient ID: Allison Moss, female   DOB: [birth year], 37 y.o.   MRN: [id number] Chief Complaint  Patient presents with  . Shoulder Problem    patient reports collar bone has been painful x's 4 days. Patient reports pain radiates down to her shoulder. Patient denies any injuries or fall.   HPI Has been taking ibuprofen and using heat with transient improvement. 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 Her collar bone hurts with both shoulder and neck movement. States she has a hx of hitting her shoulder on door frames but not in the last 3-4 weeks. Review of Systems     Objective:   Physical Exam  Constitutional: She appears well-developed and well-nourished. No distress.  Musculoskeletal:  Distal third of collar bone is tender. Increased pain with left lateral neck motion and left shoulder motion. Shoulder strength 5/5.       Assessment:    1. Pain of left clavicle: continue ibuprofen and add short term use of Percocet. - DG Clavicle Left; Future    Plan:    Further f/u pending x-ray report.

## 2018-04-12 ENCOUNTER — Telehealth: Payer: Self-pay

## 2018-04-12 NOTE — Telephone Encounter (Signed)
Patient advised.KW 

## 2018-04-12 NOTE — Telephone Encounter (Signed)
-----   Message from Anola Gurney, [place] sent at 04/11/2018  4:36 PM EDT ----- No fracture or abnormality. Continue ibuprofen, pain medication and heat.

## 2018-04-13 ENCOUNTER — Ambulatory Visit (INDEPENDENT_AMBULATORY_CARE_PROVIDER_SITE_OTHER): Payer: Medicaid Other | Admitting: Family Medicine

## 2018-04-13 DIAGNOSIS — Z23 Encounter for immunization: Secondary | ICD-10-CM | POA: Diagnosis not present

## 2018-07-19 ENCOUNTER — Emergency Department: Payer: Self-pay

## 2018-07-19 ENCOUNTER — Encounter: Payer: Self-pay | Admitting: Medical Oncology

## 2018-07-19 ENCOUNTER — Ambulatory Visit: Payer: Medicaid Other | Admitting: Family Medicine

## 2018-07-19 ENCOUNTER — Emergency Department
Admission: EM | Admit: 2018-07-19 | Discharge: 2018-07-19 | Disposition: A | Discharge status: Home / Self Care | Payer: Self-pay | Attending: Emergency Medicine | Admitting: Emergency Medicine

## 2018-07-19 DIAGNOSIS — J069 Acute upper respiratory infection, unspecified: Secondary | ICD-10-CM | POA: Insufficient documentation

## 2018-07-19 DIAGNOSIS — R69 Illness, unspecified: Secondary | ICD-10-CM

## 2018-07-19 DIAGNOSIS — F17210 Nicotine dependence, cigarettes, uncomplicated: Secondary | ICD-10-CM | POA: Insufficient documentation

## 2018-07-19 DIAGNOSIS — J111 Influenza due to unidentified influenza virus with other respiratory manifestations: Secondary | ICD-10-CM

## 2018-07-19 LAB — INFLUENZA PANEL BY PCR (TYPE A & B)
INFLBPCR: NEGATIVE
Influenza A By PCR: NEGATIVE

## 2018-07-19 MED ORDER — BENZONATATE 100 MG PO CAPS: 100.0000 mg | ORAL_CAPSULE | Freq: Three times a day (TID) | ORAL | 0 refills | Status: DC | PRN

## 2018-07-19 MED ORDER — PREDNISONE 10 MG PO TABS: ORAL_TABLET | ORAL | 0 refills | Status: DC

## 2018-07-19 MED ORDER — IBUPROFEN 800 MG PO TABS: 800.0000 mg | ORAL_TABLET | Freq: Three times a day (TID) | ORAL | 0 refills | Status: DC | PRN

## 2018-07-19 MED ORDER — IPRATROPIUM-ALBUTEROL 0.5-2.5 (3) MG/3ML IN SOLN
3.0000 mL | Freq: Once | RESPIRATORY_TRACT | Status: AC
  Administered 2018-07-19: 3 mL via RESPIRATORY_TRACT
  Filled 2018-07-19: qty 3

## 2018-07-19 MED ORDER — AZITHROMYCIN 250 MG PO TABS: ORAL_TABLET | ORAL | 0 refills | Status: DC

## 2018-07-19 MED ORDER — IBUPROFEN 600 MG PO TABS
600.0000 mg | ORAL_TABLET | Freq: Once | ORAL | Status: DC
  Filled 2018-07-19: qty 1

## 2018-07-19 NOTE — ED Provider Notes (Signed)
[facility] Emergency Department Provider Note  ____________________________________________  Time seen: Approximately 10:47 AM  I have reviewed the triage vital signs and the nursing notes.   HISTORY  Chief Complaint Influenza and Nasal Congestion    HPI Allison Moss is a 38 y.o. female that presents to the  emergency department for evaluation of fever, chills, headache, dizziness, nasal congestion, productive cough for 1.5 weeks. Patient feels like she has mucus to cough up but has been having difficulty doing so.  Patient states the body aches started yesterday. This morning she felt feverish and woke up in chills and felt warm.  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  She received her flu vaccine this year.  She smokes a pack of cigarettes per day.  She has a history of asthma.  No allergies.  Her husband thought she needed to drink more fluids. No shortness of breath, chest pain, vomiting, abdominal pain, diarrhea.  Past Medical History:  Diagnosis Date  . Arthritis    Rheumatoid and Psoriatic    Patient Active Problem List   Diagnosis Date Noted  . Calculus of kidney 01/16/2015  . H/O anxiety state 01/09/2015  . History of asthma 01/09/2015  . History of migraine headaches 01/09/2015  . Psoriasis 01/09/2015  . 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 APPENDECTOMY    . CESAREAN SECTION     x2  . KNEE ARTHROSCOPY Left     Prior to Admission medications   Medication Sig Start Date End Date Taking? Authorizing Provider  azithromycin (ZITHROMAX Z-PAK) 250 MG tablet Take 2 tablets (500 mg) on  Day 1,  followed by 1 tablet (250 mg) once daily on Days 2 through 5. 07/19/18   Enid DerryWagner, Jafeth Mustin, PA-C  benzonatate (TESSALON PERLES) 100 MG capsule Take 1 capsule (100 mg total) by mouth 3 (three) times daily as needed for cough. 07/19/18 07/19/19  Enid DerryWagner, Larayah Clute, PA-C   ibuprofen (ADVIL,MOTRIN) 800 MG tablet Take 1 tablet (800 mg total) by mouth every 8 (eight) hours as needed. 07/19/18   Enid DerryWagner, Kiptyn Rafuse, PA-C  oxyCODONE-acetaminophen (PERCOCET/ROXICET) 5-325 MG tablet Take 1 tablet by mouth every 6 (six) hours as needed for severe pain. 04/11/18   Anola Gurneyhauvin, Robert, PA  predniSONE (DELTASONE) 10 MG tablet Take 6 tablets on day 1, take 5 tablets on day 2, take 4 tablets on day 3, take 3 tablets on day 4, take 2 tablets on day 5, take 1 tablet on day 6 07/19/18   Enid DerryWagner, Merrilyn Legler, PA-C    Allergies Acetaminophen-codeine; Escitalopram; Hydrocodone-acetaminophen; Propoxyphene; and Tramadol  Family History  Problem Relation Age of Onset  . Hypothyroidism Mother   . Lung cancer Maternal Grandmother   . Hypertension Maternal Grandfather   . Arrhythmia Maternal Grandfather     Social History Social History   Tobacco Use  . Smoking status: Current Some Day Smoker    Packs/day: 1.00    Types: Cigarettes  . Smokeless tobacco: Never Used  . Tobacco comment: cuts back to 2 cigs daily   Substance Use Topics  . Alcohol use: No    Alcohol/week: 0.0 standard drinks  . Drug use: No     Review of Systems  Constitutional: Positive for fever. Eyes: No visual changes. No discharge. ENT: Positive for congestion and rhinorrhea. Cardiovascular: No chest pain. Respiratory: Positive for cough. No SOB. Gastrointestinal: No abdominal pain.  No nausea, no vomiting.  No diarrhea.  No constipation. Musculoskeletal: Positive for body aches. Skin: Negative for rash, abrasions, lacerations, ecchymosis. Neurological: Positive for headaches.   ____________________________________________   PHYSICAL EXAM:  VITAL SIGNS: ED Triage Vitals  Enc Vitals Group     BP 07/19/18 1031 (!) 146/88     Pulse Rate 07/19/18 1031 82     Resp 07/19/18 1031 18     Temp 07/19/18 1031 98 F (36.7 C)     Temp Source 07/19/18 1031 Oral     SpO2 07/19/18 1031 100 %     Weight 07/19/18 1031  240 lb (108.9 kg)     Height 07/19/18 1031 5\' 9"  (1.753 m)     Head Circumference --      Peak Flow --      Pain Score 07/19/18 1039 8     Pain Loc --      Pain Edu? --      Excl. in GC? --      Constitutional: Alert and oriented. Well appearing and in no acute distress. Eyes: Conjunctivae are normal. PERRL. EOMI. No discharge. Head: Atraumatic. ENT: No frontal and maxillary sinus tenderness.      Ears: Tympanic membranes are pink. No discharge.      Nose: Moderate congestion/rhinnorhea.      Mouth/Throat: Mucous membranes are moist. Oropharynx non-erythematous. Tonsils not enlarged. No exudates. Uvula midline. Neck: No stridor.   Hematological/Lymphatic/Immunilogical: No cervical lymphadenopathy. Cardiovascular: Normal rate, regular rhythm.  Good peripheral circulation. Respiratory: Normal respiratory effort without tachypnea or retractions. Lungs CTAB. Good air entry to the bases with no decreased or absent breath sounds. Gastrointestinal: Bowel sounds 4 quadrants. Soft and nontender to palpation. No guarding or rigidity. No palpable masses. No distention. Musculoskeletal: Full range of motion to all extremities. No gross deformities appreciated. Neurologic:  Normal speech and language. No gross focal neurologic deficits are appreciated.  Skin:  Skin is warm, dry and intact. No rash noted. Psychiatric: Mood and affect are normal. Speech and behavior are normal. Patient exhibits appropriate insight and judgement.   ____________________________________________   LABS (all labs ordered are listed, but only abnormal results are displayed)  Labs Reviewed  INFLUENZA PANEL BY PCR (TYPE A & B)   ____________________________________________  EKG  NSR  ____________________________________________  RADIOLOGY Lexine BatonI, Kimbery Harwood, personally viewed and evaluated these images (plain radiographs) as part of my medical decision making, as well as reviewing the written report by the  radiologist.  Dg Chest 2 View  Result Date: 07/19/2018 CLINICAL DATA:  Cough for 2 weeks, shortness of breath, dizziness and fever for 5 days, smoking history EXAM: CHEST - 2 VIEW COMPARISON:  Chest x-ray of 05/14/2009 FINDINGS: No active infiltrate or effusion is seen. Mediastinal and hilar contours are unremarkable. The heart is borderline enlarged and stable. No acute bony abnormality is seen. IMPRESSION: No active cardiopulmonary disease. Electronically Signed   By: Dwyane DeePaul  Barry M.D.   On: 07/19/2018 11:18    ____________________________________________    PROCEDURES  Procedure(s) performed:    Procedures    Medications  ibuprofen (ADVIL,MOTRIN) tablet 600 mg (600 mg Oral Not Given 07/19/18 1236)  ipratropium-albuterol (DUONEB) 0.5-2.5 (3) MG/3ML nebulizer solution 3 mL (3 mLs Nebulization Given 07/19/18 1236)     ____________________________________________   INITIAL IMPRESSION / ASSESSMENT AND PLAN / ED COURSE  Pertinent labs & imaging results that were available during my care of the patient were reviewed by me and considered in my medical decision making (see chart for details).  Review  of the [place] CSRS was performed in accordance of the [facility] prior to dispensing any controlled drugs.   Patient's diagnosis is consistent with influenza like illness. Vital signs and exam are reassuring.  Chest x-ray negative for acute cardia pulmonary processes. EKG shows NSR with HR 62.  Influenza test is negative however patient has had symptoms for several days so this is less accurate.  Symptoms are consistent with viral influenza like illness.  However, patient will be covered for bacterial illness since she has had symptoms for 1.5 weeks. She felt better after duoneb. Patient appears well. She is up walking around without difficulty. She drank a glass of water and juice while waiting. Patient should alternate tylenol and ibuprofen for fever. Patient feels comfortable going home. Patient  will be discharged home with prescriptions for prednisone, azithromycin, tessalon pearls, ibuprofen. Patient is to follow up with PCP as needed or otherwise directed. Patient is given ED precautions to return to the ED for any worsening or new symptoms.     ____________________________________________  FINAL CLINICAL IMPRESSION(S) / ED DIAGNOSES  Final diagnoses:  Upper respiratory tract infection, unspecified type  Influenza-like illness      NEW MEDICATIONS STARTED DURING THIS VISIT:  ED Discharge Orders         Ordered    predniSONE (DELTASONE) 10 MG tablet     07/19/18 1311    azithromycin (ZITHROMAX Z-PAK) 250 MG tablet     07/19/18 1311    benzonatate (TESSALON PERLES) 100 MG capsule  3 times daily PRN     07/19/18 1311    ibuprofen (ADVIL,MOTRIN) 800 MG tablet  Every 8 hours PRN     07/19/18 1311              This chart was dictated using voice recognition software/Dragon. Despite best efforts to proofread, errors can occur which can change the meaning. Any change was purely unintentional.    Enid Derry, PA-C 07/19/18 1607    Minna Antis, MD 07/20/18 2153

## 2018-07-19 NOTE — ED Notes (Signed)
Says her breathing does feel better after svn.

## 2018-07-19 NOTE — ED Notes (Signed)
See triage note  Presents with headache,subjective fever and congestion    States sxs' started couple of days ago

## 2018-07-19 NOTE — ED Triage Notes (Signed)
Pt reports congestion/chest congestion, body aches, headache that began 2 weeks.

## 2018-07-19 NOTE — Discharge Instructions (Signed)
Your symptoms are consistent with influenza.  However your influenza test was negative.  The influenza test is not very accurate after you have had symptoms for a couple of days.  Your chest x-ray does not show any signs of pneumonia.  I have given you a prescription for steroids and antibiotics since you have been sick for 1.5 weeks.  Please increase your fluid intake today.  You can alternate Tylenol and Motrin for your fever and body aches.  Please follow-up with primary care in 2 days and for blood pressure recheck.

## 2018-07-19 NOTE — ED Notes (Signed)
Sent swab to lab

## 2018-12-07 ENCOUNTER — Telehealth: Payer: Self-pay

## 2018-12-07 NOTE — Progress Notes (Deleted)
       Patient: Allison Moss Female    DOB: [birth year]   38 y.o.   MRN: [id number] Visit Date: 12/07/2018  Today's Provider: Margaretann Loveless, PA-C   No chief complaint on file.  Subjective:     Otalgia   Pertinent negatives include no abdominal pain or vomiting.    Allergies  Allergen Reactions  . Acetaminophen-Codeine Itching  . Escitalopram   . Hydrocodone-Acetaminophen Nausea Only  . Propoxyphene Hives  . Tramadol Hives     Current Outpatient Medications:  .  azithromycin (ZITHROMAX Z-PAK) 250 MG tablet, Take 2 tablets (500 mg) on  Day 1,  followed by 1 tablet (250 mg) once daily on Days 2 through 5., Disp: 6 each, Rfl: 0 .  benzonatate (TESSALON PERLES) 100 MG capsule, Take 1 capsule (100 mg total) by mouth 3 (three) times daily as needed for cough., Disp: 30 capsule, Rfl: 0 .  ibuprofen (ADVIL,MOTRIN) 800 MG tablet, Take 1 tablet (800 mg total) by mouth every 8 (eight) hours as needed., Disp: 30 tablet, Rfl: 0 .  oxyCODONE-acetaminophen (PERCOCET/ROXICET) 5-325 MG tablet, Take 1 tablet by mouth every 6 (six) hours as needed for severe pain., Disp: 30 tablet, Rfl: 0 .  predniSONE (DELTASONE) 10 MG tablet, Take 6 tablets on day 1, take 5 tablets on day 2, take 4 tablets on day 3, take 3 tablets on day 4, take 2 tablets on day 5, take 1 tablet on day 6, Disp: 21 tablet, Rfl: 0  Review of Systems  Constitutional: Negative for appetite change, chills, fatigue and fever.  HENT: Positive for ear pain.   Respiratory: Negative for chest tightness and shortness of breath.   Cardiovascular: Negative for chest pain and palpitations.  Gastrointestinal: Negative for abdominal pain, nausea and vomiting.  Neurological: Negative for dizziness and weakness.    Social History   Tobacco Use  . Smoking status: Current Some Day Smoker    Packs/day: 1.00    Types: Cigarettes  . Smokeless tobacco: Never Used  . Tobacco comment: cuts back to 2 cigs daily   Substance Use Topics  .  Alcohol use: No    Alcohol/week: 0.0 standard drinks      Objective:   There were no vitals taken for this visit. There were no vitals filed for this visit.   Physical Exam      Assessment & Plan        Margaretann Loveless, PA-C  [facility]

## 2018-12-07 NOTE — Telephone Encounter (Signed)
Former Clinical biochemist patient. Patient called requesting appointment. She complains of right Ear pain since yesterday that has progressively worsened. She also has PND. Patient denies fever, chills, headache or any other symptoms. Please advise if ok to schedule ov. She does not want to do a virtual visit.

## 2018-12-07 NOTE — Telephone Encounter (Signed)
Patient was advised and appt was scheduled.  

## 2018-12-07 NOTE — Telephone Encounter (Signed)
Yes this is ok. Just make sure she wears a mask in please to protect anyone else in the office as she comes in before we get her in a room

## 2018-12-08 ENCOUNTER — Encounter: Payer: Self-pay | Admitting: Physician Assistant

## 2018-12-08 ENCOUNTER — Ambulatory Visit: Payer: 59 | Admitting: Physician Assistant

## 2018-12-08 ENCOUNTER — Ambulatory Visit: Payer: Self-pay | Admitting: Physician Assistant

## 2018-12-08 ENCOUNTER — Other Ambulatory Visit: Payer: Self-pay

## 2018-12-08 VITALS — BP 110/73 | HR 89 | Temp 98.2°F | Resp 16 | Wt 253.2 lb

## 2018-12-08 DIAGNOSIS — Z716 Tobacco abuse counseling: Secondary | ICD-10-CM | POA: Diagnosis not present

## 2018-12-08 DIAGNOSIS — R002 Palpitations: Secondary | ICD-10-CM

## 2018-12-08 DIAGNOSIS — H66001 Acute suppurative otitis media without spontaneous rupture of ear drum, right ear: Secondary | ICD-10-CM

## 2018-12-08 MED ORDER — AMOXICILLIN-POT CLAVULANATE 875-125 MG PO TABS: 1.0000 | ORAL_TABLET | Freq: Two times a day (BID) | ORAL | 0 refills | Status: DC

## 2018-12-08 NOTE — Patient Instructions (Signed)
Otitis Media, Adult    Otitis media occurs when there is inflammation and fluid in the middle ear. Your middle ear is a part of the ear that contains bones for hearing as well as air that helps send sounds to your brain.  What are the causes?  This condition is caused by a blockage in the eustachian tube. This tube drains fluid from the ear to the back of the nose (nasopharynx). A blockage in this tube can be caused by an object or by swelling (edema) in the tube. Problems that can cause a blockage include:   A cold or other upper respiratory infection.   Allergies.   An irritant, such as tobacco smoke.   Enlarged adenoids. The adenoids are areas of soft tissue located high in the back of the throat, behind the nose and the roof of the mouth.   A mass in the nasopharynx.   Damage to the ear caused by pressure changes (barotrauma).  What are the signs or symptoms?  Symptoms of this condition include:   Ear pain.   A fever.   Decreased hearing.   A headache.   Tiredness (lethargy).   Fluid leaking from the ear.   Ringing in the ear.  How is this diagnosed?  This condition is diagnosed with a physical exam. During the exam your health care provider will use an instrument called an otoscope to look into your ear and check for redness, swelling, and fluid. He or she will also ask about your symptoms.  Your health care provider may also order tests, such as:   A test to check the movement of the eardrum (pneumatic otoscopy). This test is done by squeezing a small amount of air into the ear.   A test that changes air pressure in the middle ear to check how well the eardrum moves and whether the eustachian tube is working (tympanogram).  How is this treated?  This condition usually goes away on its own within 3-5 days. But if the condition is caused by a bacteria infection and does not go away own its own, or keeps coming back, your health care provider may:   Prescribe antibiotic medicines to treat the  infection.   Prescribe or recommend medicines to control pain.  Follow these instructions at home:   Take over-the-counter and prescription medicines only as told by your health care provider.   If you were prescribed an antibiotic medicine, take it as told by your health care provider. Do not stop taking the antibiotic even if you start to feel better.   Keep all follow-up visits as told by your health care provider. This is important.  Contact a health care provider if:   You have bleeding from your nose.   There is a lump on your neck.   You are not getting better in 5 days.   You feel worse instead of better.  Get help right away if:   You have severe pain that is not controlled with medicine.   You have swelling, redness, or pain around your ear.   You have stiffness in your neck.   A part of your face is paralyzed.   The bone behind your ear (mastoid) is tender when you touch it.   You develop a severe headache.  Summary   Otitis media is redness, soreness, and swelling of the middle ear.   This condition usually goes away on its own within 3-5 days.   If the problem does   not go away in 3-5 days, your health care provider may prescribe or recommend medicines to treat your symptoms.   If you were prescribed an antibiotic medicine, take it as told by your health care provider.  This information is not intended to replace advice given to you by your health care provider. Make sure you discuss any questions you have with your health care provider.  Document Released: 03/27/2004 Document Revised: 06/12/2016 Document Reviewed: 06/12/2016  Elsevier Interactive Patient Education  2019 [employer]        Eustachian Tube Dysfunction    Eustachian tube dysfunction refers to a condition in which a blockage develops in the narrow passage that connects the middle ear to the back of the nose (eustachian tube). The eustachian tube regulates air pressure in the middle ear by letting air move between the  ear and nose. It also helps to drain fluid from the middle ear space.  Eustachian tube dysfunction can affect one or both ears. When the eustachian tube does not function properly, air pressure, fluid, or both can build up in the middle ear.  What are the causes?  This condition occurs when the eustachian tube becomes blocked or cannot open normally. Common causes of this condition include:   Ear infections.   Colds and other infections that affect the nose, mouth, and throat (upper respiratory tract).   Allergies.   Irritation from cigarette smoke.   Irritation from stomach acid coming up into the esophagus (gastroesophageal reflux). The esophagus is the tube that carries food from the mouth to the stomach.   Sudden changes in air pressure, such as from descending in an airplane or scuba diving.   Abnormal growths in the nose or throat, such as:  ? Growths that line the nose (nasal polyps).  ? Abnormal growth of cells (tumors).  ? Enlarged tissue at the back of the throat (adenoids).  What increases the risk?  You are more likely to develop this condition if:   You smoke.   You are overweight.   You are a child who has:  ? Certain birth defects of the mouth, such as cleft palate.  ? Large tonsils or adenoids.  What are the signs or symptoms?  Common symptoms of this condition include:   A feeling of fullness in the ear.   Ear pain.   Clicking or popping noises in the ear.   Ringing in the ear.   Hearing loss.   Loss of balance.   Dizziness.  Symptoms may get worse when the air pressure around you changes, such as when you travel to an area of high elevation, fly on an airplane, or go scuba diving.  How is this diagnosed?  This condition may be diagnosed based on:   Your symptoms.   A physical exam of your ears, nose, and throat.   Tests, such as those that measure:  ? The movement of your eardrum (tympanogram).  ? Your hearing (audiometry).  How is this treated?  Treatment depends on the cause  and severity of your condition.   In mild cases, you may relieve your symptoms by moving air into your ears. This is called "popping the ears."   In more severe cases, or if you have symptoms of fluid in your ears, treatment may include:  ? Medicines to relieve congestion (decongestants).  ? Medicines that treat allergies (antihistamines).  ? Nasal sprays or ear drops that contain medicines that reduce swelling (steroids).  ? A procedure to   drain the fluid in your eardrum (myringotomy). In this procedure, a small tube is placed in the eardrum to:   Drain the fluid.   Restore the air in the middle ear space.  ? A procedure to insert a balloon device through the nose to inflate the opening of the eustachian tube (balloon dilation).  Follow these instructions at home:  Lifestyle   Do not do any of the following until your health care provider approves:  ? Travel to high altitudes.  ? Fly in airplanes.  ? Work in a pressurized cabin or room.  ? Scuba dive.   Do not use any products that contain nicotine or tobacco, such as cigarettes and e-cigarettes. If you need help quitting, ask your health care provider.   Keep your ears dry. Wear fitted earplugs during showering and bathing. Dry your ears completely after.  General instructions   Take over-the-counter and prescription medicines only as told by your health care provider.   Use techniques to help pop your ears as recommended by your health care provider. These may include:  ? Chewing gum.  ? Yawning.  ? Frequent, forceful swallowing.  ? Closing your mouth, holding your nose closed, and gently blowing as if you are trying to blow air out of your nose.   Keep all follow-up visits as told by your health care provider. This is important.  Contact a health care provider if:   Your symptoms do not go away after treatment.   Your symptoms come back after treatment.   You are unable to pop your ears.   You have:  ? A fever.  ? Pain in your ear.  ? Pain in your  head or neck.  ? Fluid draining from your ear.   Your hearing suddenly changes.   You become very dizzy.   You lose your balance.  Summary   Eustachian tube dysfunction refers to a condition in which a blockage develops in the eustachian tube.   It can be caused by ear infections, allergies, inhaled irritants, or abnormal growths in the nose or throat.   Symptoms include ear pain, hearing loss, or ringing in the ears.   Mild cases are treated with maneuvers to unblock the ears, such as yawning or ear popping.   Severe cases are treated with medicines. Surgery may also be done (rare).  This information is not intended to replace advice given to you by your health care provider. Make sure you discuss any questions you have with your health care provider.  Document Released: 07/19/2015 Document Revised: 10/12/2017 Document Reviewed: 10/12/2017  Elsevier Interactive Patient Education  2019 [employer] Inc[employer]

## 2018-12-08 NOTE — Progress Notes (Signed)
Patient: Allison Moss Female    DOB: [birth year]   38 y.o.   MRN: [id number] Visit Date: 12/08/2018  Today's Provider: Margaretann Loveless, PA-C   Chief Complaint  Patient presents with  . Otalgia   Subjective:     Otalgia   There is pain in the right ear. This is a new problem. The current episode started in the past 7 days. The problem occurs constantly (reports that when something is really loud she has a ring in her ear, but has been having constant pain since yesterday). There has been no fever. Associated symptoms include hearing loss. Pertinent negatives include no abdominal pain, coughing, headaches (on right ear), rhinorrhea or sore throat. She has tried acetaminophen (500mg  (2 tablet) every 8 hours) for the symptoms. The treatment provided no relief.   Patient had palpitations in the past before coronavirus. Family history a heart disease.   Allergies  Allergen Reactions  . Acetaminophen-Codeine Itching  . Escitalopram   . Hydrocodone-Acetaminophen Nausea Only  . Propoxyphene Hives  . Tramadol Hives     Current Outpatient Medications:  .  ibuprofen (ADVIL,MOTRIN) 800 MG tablet, Take 1 tablet (800 mg total) by mouth every 8 (eight) hours as needed. (Patient not taking: Reported on 12/08/2018), Disp: 30 tablet, Rfl: 0 .  oxyCODONE-acetaminophen (PERCOCET/ROXICET) 5-325 MG tablet, Take 1 tablet by mouth every 6 (six) hours as needed for severe pain. (Patient not taking: Reported on 12/08/2018), Disp: 30 tablet, Rfl: 0  Review of Systems  Constitutional: Negative for diaphoresis, fatigue and fever.  HENT: Positive for ear pain and hearing loss. Negative for rhinorrhea and sore throat.   Respiratory: Negative for cough, chest tightness, shortness of breath and wheezing.   Cardiovascular: Positive for palpitations. Negative for chest pain and leg swelling.  Gastrointestinal: Negative for abdominal pain.  Neurological: Negative for dizziness and headaches (on right ear).     Social History   Tobacco Use  . Smoking status: Current Some Day Smoker    Packs/day: 1.00    Types: Cigarettes  . Smokeless tobacco: Never Used  . Tobacco comment: cuts back to 2 cigs daily   Substance Use Topics  . Alcohol use: No    Alcohol/week: 0.0 standard drinks      Objective:   BP 110/73 (BP Location: Left Arm, Patient Position: Sitting, Cuff Size: Large)   Pulse 89   Temp 98.2 F (36.8 C) (Oral)   Resp 16   Wt 253 lb 3.2 oz (114.9 kg)   BMI 37.39 kg/m  Vitals:   12/08/18 1423  BP: 110/73  Pulse: 89  Resp: 16  Temp: 98.2 F (36.8 C)  TempSrc: Oral  Weight: 253 lb 3.2 oz (114.9 kg)     Physical Exam Vitals signs reviewed.  Constitutional:      General: She is not in acute distress.    Appearance: Normal appearance. She is well-developed. She is obese. She is not ill-appearing or diaphoretic.  HENT:     Head: Normocephalic and atraumatic.     Right Ear: Hearing, ear canal and external ear normal. A middle ear effusion is present. Tympanic membrane is erythematous.     Left Ear: Hearing, tympanic membrane, ear canal and external ear normal.     Nose: Nose normal.     Mouth/Throat:     Mouth: Mucous membranes are moist.     Pharynx: Uvula midline. No oropharyngeal exudate or posterior oropharyngeal erythema.  Eyes:  General: No scleral icterus.       Right eye: No discharge.        Left eye: No discharge.     Conjunctiva/sclera: Conjunctivae normal.     Pupils: Pupils are equal, round, and reactive to light.  Neck:     Musculoskeletal: Normal range of motion and neck supple.     Thyroid: No thyromegaly.     Trachea: No tracheal deviation.  Cardiovascular:     Rate and Rhythm: Normal rate and regular rhythm.     Pulses: Normal pulses.     Heart sounds: Normal heart sounds. No murmur. No friction rub. No gallop.   Pulmonary:     Effort: Pulmonary effort is normal. No respiratory distress.     Breath sounds: Normal breath sounds. No stridor.  No wheezing or rales.  Lymphadenopathy:     Cervical: No cervical adenopathy.  Skin:    General: Skin is warm and dry.     Capillary Refill: Capillary refill takes less than 2 seconds.  Neurological:     Mental Status: She is alert.        Assessment & Plan    1. Non-recurrent acute suppurative otitis media of right ear without spontaneous rupture of tympanic membrane Worsening symptoms that have not responded to OTC medications. Will give augmentin as below. Continue allergy medications. Stay well hydrated and get plenty of rest. Call if no symptom improvement or if symptoms worsen. - amoxicillin-clavulanate (AUGMENTIN) 875-125 MG tablet; Take 1 tablet by mouth 2 (two) times daily.  Dispense: 20 tablet; Refill: 0  2. Palpitations EKG today showed NSR rate of 71 with no ST changes.  - EKG 12-Lead  3. Encounter for smoking cessation counseling Discussed smoking cessation and tips for quitting. Not quite ready to quit completely but would like to cut back. May consider help with quitting in the next 3-6 months.      Margaretann LovelessJennifer M Burnette, PA-C  [facility]

## 2018-12-16 ENCOUNTER — Encounter: Payer: Self-pay | Admitting: Physician Assistant

## 2018-12-16 DIAGNOSIS — B379 Candidiasis, unspecified: Secondary | ICD-10-CM

## 2018-12-19 MED ORDER — FLUCONAZOLE 150 MG PO TABS: 150.0000 mg | ORAL_TABLET | Freq: Once | ORAL | 0 refills | Status: AC

## 2019-01-10 ENCOUNTER — Ambulatory Visit: Payer: Self-pay | Admitting: Physician Assistant

## 2019-01-10 NOTE — Progress Notes (Deleted)
       Patient: Allison Moss Female    DOB: 1980-11-14   38 y.o.   MRN: [id number] Visit Date: 01/10/2019  Today's Provider: Mar Daring, PA-C   No chief complaint on file.  Subjective:     HPI  Allergies  Allergen Reactions  . Acetaminophen-Codeine Itching  . Escitalopram   . Hydrocodone-Acetaminophen Nausea Only  . Propoxyphene Hives  . Tramadol Hives     Current Outpatient Medications:  .  amoxicillin-clavulanate (AUGMENTIN) 875-125 MG tablet, Take 1 tablet by mouth 2 (two) times daily., Disp: 20 tablet, Rfl: 0 .  ibuprofen (ADVIL,MOTRIN) 800 MG tablet, Take 1 tablet (800 mg total) by mouth every 8 (eight) hours as needed. (Patient not taking: Reported on 12/08/2018), Disp: 30 tablet, Rfl: 0 .  oxyCODONE-acetaminophen (PERCOCET/ROXICET) 5-325 MG tablet, Take 1 tablet by mouth every 6 (six) hours as needed for severe pain. (Patient not taking: Reported on 12/08/2018), Disp: 30 tablet, Rfl: 0  Review of Systems  Social History   Tobacco Use  . Smoking status: Current Some Day Smoker    Packs/day: 1.00    Types: Cigarettes  . Smokeless tobacco: Never Used  . Tobacco comment: cuts back to 2 cigs daily   Substance Use Topics  . Alcohol use: No    Alcohol/week: 0.0 standard drinks      Objective:   There were no vitals taken for this visit. There were no vitals filed for this visit.   Physical Exam   No results found for any visits on 01/10/19.     Assessment & [place], PA-C  [facility]

## 2019-01-13 ENCOUNTER — Encounter: Payer: Self-pay | Admitting: Physician Assistant

## 2019-01-18 NOTE — Progress Notes (Deleted)
Patient: Allison Moss, Female    DOB: [birth year], 38 y.o.   MRN: [id number] Visit Date: 01/18/2019  Today's Provider: Mar Daring, PA-C   No chief complaint on file.  Subjective:     Annual physical exam Allison Moss is a 38 y.o. female who presents today for health maintenance and complete physical. She feels {DESC; WELL/FAIRLY WELL/POORLY:18703}. She reports exercising ***. She reports she is sleeping {DESC; WELL/FAIRLY WELL/POORLY:18703}.  -----------------------------------------------------------------   Review of Systems  Social History      She  reports that she has been smoking cigarettes. She has been smoking about 1.00 pack per day. She has never used smokeless tobacco. She reports that she does not drink alcohol or use drugs.       Social History   Socioeconomic History  . Marital status: Married    Spouse name: Not on file  . Number of children: Not on file  . Years of education: Not on file  . Highest education level: Not on file  Occupational History  . Not on file  Social Needs  . Financial resource strain: Not on file  . Food insecurity    Worry: Not on file    Inability: Not on file  . Transportation needs    Medical: Not on file    Non-medical: Not on file  Tobacco Use  . Smoking status: Current Some Day Smoker    Packs/day: 1.00    Types: Cigarettes  . Smokeless tobacco: Never Used  . Tobacco comment: cuts back to 2 cigs daily   Substance and Sexual Activity  . Alcohol use: No    Alcohol/week: 0.0 standard drinks  . Drug use: No  . Sexual activity: Not on file  Lifestyle  . Physical activity    Days per week: Not on file    Minutes per session: Not on file  . Stress: Not on file  Relationships  . Social Herbalist on phone: Not on file    Gets together: Not on file    Attends religious service: Not on file    Active member of club or organization: Not on file    Attends meetings of clubs or  organizations: Not on file    Relationship status: Not on file  Other Topics Concern  . Not on file  Social History Narrative  . Not on file    Past Medical History:  Diagnosis Date  . Arthritis    Rheumatoid and Psoriatic     Patient Active Problem List   Diagnosis Date Noted  . Calculus of kidney 01/16/2015  . H/O anxiety state 01/09/2015  . History of asthma 01/09/2015  . History of migraine headaches 01/09/2015  . Psoriasis 01/09/2015  . 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 APPENDECTOMY    . CESAREAN SECTION     x2  . KNEE ARTHROSCOPY Left     Family History        Family Status  Relation Name Status  . Mother  Alive  . Father  Alive  . Brother  Alive  . Son 1 Alive  . MGM  Deceased  . MGF  Deceased  . PGF  Deceased  . Son 2 Alive        Her family history includes Arrhythmia in her maternal grandfather; Hypertension in her maternal grandfather; Hypothyroidism in her mother; Lung cancer in her maternal grandmother.  Allergies  Allergen Reactions  . Acetaminophen-Codeine Itching  . Escitalopram   . Hydrocodone-Acetaminophen Nausea Only  . Propoxyphene Hives  . Tramadol Hives     Current Outpatient Medications:  .  amoxicillin-clavulanate (AUGMENTIN) 875-125 MG tablet, Take 1 tablet by mouth 2 (two) times daily., Disp: 20 tablet, Rfl: 0 .  ibuprofen (ADVIL,MOTRIN) 800 MG tablet, Take 1 tablet (800 mg total) by mouth every 8 (eight) hours as needed. (Patient not taking: Reported on 12/08/2018), Disp: 30 tablet, Rfl: 0 .  oxyCODONE-acetaminophen (PERCOCET/ROXICET) 5-325 MG tablet, Take 1 tablet by mouth every 6 (six) hours as needed for severe pain. (Patient not taking: Reported on 12/08/2018), Disp: 30 tablet, Rfl: 0   Patient Care Team: Anola Gurneyhauvin, Robert, PA as PCP - General (Family Medicine)    Objective:    Vitals: There were no vitals taken for this visit.  There were no vitals filed  for this visit.   Physical Exam   Depression Screen PHQ 2/9 Scores 12/08/2018 11/09/2016  PHQ - 2 Score 0 0  PHQ- 9 Score - 5       Assessment & Plan:     Routine Health Maintenance and Physical Exam  Exercise Activities and Dietary recommendations Goals   None     Immunization History  Administered Date(s) Administered  . Influenza,inj,Quad PF,6+ Mos 04/13/2018  . Td 11/09/2016  . Tdap 09/13/2006    Health Maintenance  Topic Date Due  . HIV Screening  09/10/1996  . INFLUENZA VACCINE  02/04/2019  . PAP SMEAR-Modifier  11/10/2019  . TETANUS/TDAP  11/10/2026     Discussed health benefits of physical activity, and encouraged her to engage in regular exercise appropriate for her age and condition.    --------------------------------------------------------------------    Allison LovelessJennifer M Burnette, PA-C  [facility]

## 2019-01-19 ENCOUNTER — Encounter: Payer: Self-pay | Admitting: Physician Assistant

## 2019-01-19 NOTE — Progress Notes (Signed)
Patient: Allison Moss, Female    DOB: 06/30/1981, 38 y.o.   MRN: [id number] Visit Date: 01/20/2019  Today's Provider: Margaretann LovelessJennifer M , PA-C   Chief Complaint  Patient presents with  . Annual Exam   Subjective:     Annual physical exam Allison Quickshleigh S Tashiro is a 38 y.o. female who presents today for health maintenance and complete physical. She feels well. She reports exercising. She reports she is sleeping well. ----------------------------------------------------------------- PAP/HPV: 11/11/16- normal. Repeat in 5 years.  Review of Systems  Constitutional: Negative.   HENT: Positive for ear pain and hearing loss.   Eyes: Negative.   Respiratory: Negative.   Cardiovascular: Negative.   Gastrointestinal: Negative.   Endocrine: Negative.   Genitourinary: Negative.   Musculoskeletal: Negative.   Skin: Negative.   Allergic/Immunologic: Negative.   Neurological: Negative.   Hematological: Negative.   Psychiatric/Behavioral: Negative.     Social History      She  reports that she has been smoking cigarettes. She has been smoking about 1.00 pack per day. She has never used smokeless tobacco. She reports that she does not drink alcohol or use drugs.       Social History   Socioeconomic History  . Marital status: Married    Spouse name: Not on file  . Number of children: Not on file  . Years of education: Not on file  . Highest education level: Not on file  Occupational History  . Not on file  Social Needs  . Financial resource strain: Not on file  . Food insecurity    Worry: Not on file    Inability: Not on file  . Transportation needs    Medical: Not on file    Non-medical: Not on file  Tobacco Use  . Smoking status: Current Some Day Smoker    Packs/day: 1.00    Types: Cigarettes  . Smokeless tobacco: Never Used  . Tobacco comment: cuts back to 2 cigs daily   Substance and Sexual Activity  . Alcohol use: No    Alcohol/week: 0.0 standard drinks  .  Drug use: No  . Sexual activity: Not on file  Lifestyle  . Physical activity    Days per week: Not on file    Minutes per session: Not on file  . Stress: Not on file  Relationships  . Social Musicianconnections    Talks on phone: Not on file    Gets together: Not on file    Attends religious service: Not on file    Active member of club or organization: Not on file    Attends meetings of clubs or organizations: Not on file    Relationship status: Not on file  Other Topics Concern  . Not on file  Social History Narrative  . Not on file    Past Medical History:  Diagnosis Date  . Arthritis    Rheumatoid and Psoriatic     Patient Active Problem List   Diagnosis Date Noted  . Calculus of kidney 01/16/2015  . H/O anxiety state 01/09/2015  . History of asthma 01/09/2015  . History of migraine headaches 01/09/2015  . Psoriasis 01/09/2015  . 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 APPENDECTOMY    . CESAREAN SECTION     x2  . KNEE ARTHROSCOPY Left     Family History        Family Status  Relation Name Status  .  Mother  Alive  . Father  Alive  . Brother  Alive  . Son 1 Alive  . MGM  Deceased  . MGF  Deceased  . PGF  Deceased  . Son 2 Alive        Her family history includes Arrhythmia in her maternal grandfather; Hypertension in her maternal grandfather; Hypothyroidism in her mother; Lung cancer in her maternal grandmother.      Allergies  Allergen Reactions  . Acetaminophen-Codeine Itching  . Escitalopram   . Hydrocodone-Acetaminophen Nausea Only  . Propoxyphene Hives  . Tramadol Hives     Current Outpatient Medications:  Marland Kitchen.  Glucos-Chond-Hyal Ac-Ca Fructo (MOVE FREE JOINT HEALTH ADVANCE) TABS, Take by mouth., Disp: , Rfl:  .  Omega-3 Fatty Acids (FISH OIL) 1000 MG CAPS, Take by mouth., Disp: , Rfl:  .  TURMERIC PO, Take by mouth., Disp: , Rfl:    Patient Care Team: Reine JustBurnette,  M, PA-C as PCP -  General (Family Medicine)    Objective:    Vitals: BP 129/87 (BP Location: Left Arm, Patient Position: Sitting, Cuff Size: Normal)   Pulse 78   Temp 98.1 F (36.7 C) (Oral)   Resp 16   Ht 5' 8.5" (1.74 m)   Wt 254 lb 12.8 oz (115.6 kg)   BMI 38.18 kg/m    Vitals:   01/20/19 1423  BP: 129/87  Pulse: 78  Resp: 16  Temp: 98.1 F (36.7 C)  TempSrc: Oral  Weight: 254 lb 12.8 oz (115.6 kg)  Height: 5' 8.5" (1.74 m)     Physical Exam Vitals signs reviewed.  Constitutional:      General: She is not in acute distress.    Appearance: Normal appearance. She is well-developed. She is obese. She is not ill-appearing or diaphoretic.  HENT:     Head: Normocephalic and atraumatic.     Right Ear: Ear canal and external ear normal. Swelling (redness in EAC) and tenderness present. A middle ear effusion (clear now) is present. Tympanic membrane is bulging. Tympanic membrane is not perforated or erythematous.     Left Ear: Tympanic membrane, ear canal and external ear normal. Swelling (redness in EAC) present.  No middle ear effusion. Tympanic membrane is not perforated, erythematous or bulging.     Nose: Nose normal.     Mouth/Throat:     Mouth: Mucous membranes are moist.     Pharynx: Oropharynx is clear. No oropharyngeal exudate or posterior oropharyngeal erythema.  Eyes:     General: No scleral icterus.       Right eye: No discharge.        Left eye: No discharge.     Extraocular Movements: Extraocular movements intact.     Conjunctiva/sclera: Conjunctivae normal.     Pupils: Pupils are equal, round, and reactive to light.  Neck:     Musculoskeletal: Normal range of motion and neck supple.     Thyroid: No thyromegaly.     Vascular: No JVD.     Trachea: No tracheal deviation.  Cardiovascular:     Rate and Rhythm: Normal rate and regular rhythm.     Pulses: Normal pulses.     Heart sounds: Normal heart sounds. No murmur. No friction rub. No gallop.   Pulmonary:     Effort:  Pulmonary effort is normal. No respiratory distress.     Breath sounds: Normal breath sounds. No wheezing or rales.  Chest:     Chest wall: No tenderness.  Abdominal:  General: Bowel sounds are normal. There is no distension.     Palpations: Abdomen is soft. There is no mass.     Tenderness: There is no abdominal tenderness. There is no guarding or rebound.  Musculoskeletal: Normal range of motion.        General: No tenderness.  Lymphadenopathy:     Cervical: No cervical adenopathy.  Skin:    General: Skin is warm and dry.     Capillary Refill: Capillary refill takes less than 2 seconds.     Findings: No rash.  Neurological:     General: No focal deficit present.     Mental Status: She is alert and oriented to person, place, and time. Mental status is at baseline.     Cranial Nerves: No cranial nerve deficit.     Motor: No weakness.     Coordination: Coordination normal.     Gait: Gait normal.  Psychiatric:        Mood and Affect: Mood normal.        Behavior: Behavior normal.        Thought Content: Thought content normal.        Judgment: Judgment normal.      Depression Screen PHQ 2/9 Scores 12/08/2018 11/09/2016  PHQ - 2 Score 0 0  PHQ- 9 Score - 5       Assessment & Plan:     Routine Health Maintenance and Physical Exam  Exercise Activities and Dietary recommendations Goals   None     Immunization History  Administered Date(s) Administered  . Influenza,inj,Quad PF,6+ Mos 04/13/2018  . Td 11/09/2016  . Tdap 09/13/2006    Health Maintenance  Topic Date Due  . HIV Screening  09/10/1996  . INFLUENZA VACCINE  02/04/2019  . PAP SMEAR-Modifier  11/10/2019  . TETANUS/TDAP  11/10/2026     Discussed health benefits of physical activity, and encouraged her to engage in regular exercise appropriate for her age and condition.    1. Annual physical exam Normal physical exam today. Will check labs as below and f/u pending lab results. If labs are stable and  WNL she will not need to have these rechecked for one year at her next annual physical exam. She is to call the office in the meantime if she has any acute issue, questions or concerns. - CBC with Differential/Platelet - Comprehensive metabolic panel - Hemoglobin A1c - Lipid panel - TSH  2. Tobacco use disorder, continuous Does not desire to quit.  3. Class 2 severe obesity due to excess calories with serious comorbidity and body mass index (BMI) of 38.0 to 38.9 in adult [facility](HCC) Will start phentermine as below. Advised to use MyFitnessPal for calorie counting. Limit to 1200-1300 calories per day. Increase exercise to 150 min of moderate activity per week. Return in 3 months for weight follow up.  - phentermine (ADIPEX-P) 37.5 MG tablet; Take 1 tablet (37.5 mg total) by mouth daily before breakfast.  Dispense: 30 tablet; Refill: 2  4. Arthropathic psoriasis (HCC) Stable.   5. Other infective acute otitis externa of both ears Patient had OM in the right ear and was treated. However she continues to have ETD, hearing loss and now otitis externa. Will treat Otits externa with cortisporin as below. Will refer to ENT for hearing loss. Flonase will be prescribed for ETD. Call if worsening in the meantime.  - Ambulatory referral to ENT - neomycin-polymyxin-hydrocortisone (CORTISPORIN) OTIC solution; Place 3 drops into both ears 2 (two) times a day.  X 5-7 days  Dispense: 10 mL; Refill: 0  6. Dysfunction of right eustachian tube See above medical treatment plan. - Ambulatory referral to ENT - fluticasone (FLONASE) 50 MCG/ACT nasal spray; Place 2 sprays into both nostrils daily.  Dispense: 16 g; Refill: 6  7. Bilateral hearing loss, unspecified hearing loss type See above medical treatment plan. - Ambulatory referral to ENT  8. Screening for HIV without presence of risk factors Will check labs as below and f/u pending results. - HIV antibody (with reflex)   --------------------------------------------------------------------    Mar Daring, PA-C  [facility]

## 2019-01-20 ENCOUNTER — Encounter: Payer: Self-pay | Admitting: Physician Assistant

## 2019-01-20 ENCOUNTER — Other Ambulatory Visit: Payer: Self-pay

## 2019-01-20 ENCOUNTER — Ambulatory Visit (INDEPENDENT_AMBULATORY_CARE_PROVIDER_SITE_OTHER): Payer: 59 | Admitting: Physician Assistant

## 2019-01-20 VITALS — BP 129/87 | HR 78 | Temp 98.1°F | Resp 16 | Ht 68.5 in | Wt 254.8 lb

## 2019-01-20 DIAGNOSIS — Z114 Encounter for screening for human immunodeficiency virus [HIV]: Secondary | ICD-10-CM | POA: Diagnosis not present

## 2019-01-20 DIAGNOSIS — H6981 Other specified disorders of Eustachian tube, right ear: Secondary | ICD-10-CM

## 2019-01-20 DIAGNOSIS — Z6838 Body mass index (BMI) 38.0-38.9, adult: Secondary | ICD-10-CM

## 2019-01-20 DIAGNOSIS — H60393 Other infective otitis externa, bilateral: Secondary | ICD-10-CM | POA: Diagnosis not present

## 2019-01-20 DIAGNOSIS — F17209 Nicotine dependence, unspecified, with unspecified nicotine-induced disorders: Secondary | ICD-10-CM | POA: Diagnosis not present

## 2019-01-20 DIAGNOSIS — Z0000 Encounter for general adult medical examination without abnormal findings: Secondary | ICD-10-CM | POA: Diagnosis not present

## 2019-01-20 DIAGNOSIS — H9193 Unspecified hearing loss, bilateral: Secondary | ICD-10-CM | POA: Diagnosis not present

## 2019-01-20 DIAGNOSIS — L4050 Arthropathic psoriasis, unspecified: Secondary | ICD-10-CM

## 2019-01-20 MED ORDER — FLUTICASONE PROPIONATE 50 MCG/ACT NA SUSP: 2.0000 | Freq: Every day | NASAL | 6 refills | Status: DC

## 2019-01-20 MED ORDER — PHENTERMINE HCL 37.5 MG PO TABS: 37.5000 mg | ORAL_TABLET | Freq: Every day | ORAL | 2 refills | Status: DC

## 2019-01-20 MED ORDER — NEOMYCIN-POLYMYXIN-HC 3.5-10000-1 OT SOLN: 3.0000 [drp] | Freq: Two times a day (BID) | OTIC | 0 refills | Status: DC

## 2019-01-20 NOTE — Patient Instructions (Signed)
Health Maintenance, Female Adopting a healthy lifestyle and getting preventive care are important in promoting health and wellness. Ask your health care provider about:  The right schedule for you to have regular tests and exams.  Things you can do on your own to prevent diseases and keep yourself healthy. What should I know about diet, weight, and exercise? Eat a healthy diet   Eat a diet that includes plenty of vegetables, fruits, low-fat dairy products, and lean protein.  Do not eat a lot of foods that are high in solid fats, added sugars, or sodium. Maintain a healthy weight Body mass index (BMI) is used to identify weight problems. It estimates body fat based on height and weight. Your health care provider can help determine your BMI and help you achieve or maintain a healthy weight. Get regular exercise Get regular exercise. This is one of the most important things you can do for your health. Most adults should:  Exercise for at least 150 minutes each week. The exercise should increase your heart rate and make you sweat (moderate-intensity exercise).  Do strengthening exercises at least twice a week. This is in addition to the moderate-intensity exercise.  Spend less time sitting. Even light physical activity can be beneficial. Watch cholesterol and blood lipids Have your blood tested for lipids and cholesterol at 37 years of age, then have this test every 5 years. Have your cholesterol levels checked more often if:  Your lipid or cholesterol levels are high.  You are older than 37 years of age.  You are at high risk for heart disease. What should I know about cancer screening? Depending on your health history and family history, you may need to have cancer screening at various ages. This may include screening for:  Breast cancer.  Cervical cancer.  Colorectal cancer.  Skin cancer.  Lung cancer. What should I know about heart disease, diabetes, and high blood  pressure? Blood pressure and heart disease  High blood pressure causes heart disease and increases the risk of stroke. This is more likely to develop in people who have high blood pressure readings, are of African descent, or are overweight.  Have your blood pressure checked: ? Every 3-5 years if you are 18-39 years of age. ? Every year if you are 40 years old or older. Diabetes Have regular diabetes screenings. This checks your fasting blood sugar level. Have the screening done:  Once every three years after age 40 if you are at a normal weight and have a low risk for diabetes.  More often and at a younger age if you are overweight or have a high risk for diabetes. What should I know about preventing infection? Hepatitis B If you have a higher risk for hepatitis B, you should be screened for this virus. Talk with your health care provider to find out if you are at risk for hepatitis B infection. Hepatitis C Testing is recommended for:  Everyone born from 1945 through 1965.  Anyone with known risk factors for hepatitis C. Sexually transmitted infections (STIs)  Get screened for STIs, including gonorrhea and chlamydia, if: ? You are sexually active and are younger than 37 years of age. ? You are older than 37 years of age and your health care provider tells you that you are at risk for this type of infection. ? Your sexual activity has changed since you were last screened, and you are at increased risk for chlamydia or gonorrhea. Ask your health care provider if   you are at risk.  Ask your health care provider about whether you are at high risk for HIV. Your health care provider may recommend a prescription medicine to help prevent HIV infection. If you choose to take medicine to prevent HIV, you should first get tested for HIV. You should then be tested every 3 months for as long as you are taking the medicine. Pregnancy  If you are about to stop having your period (premenopausal) and  you may become pregnant, seek counseling before you get pregnant.  Take 400 to 800 micrograms (mcg) of folic acid every day if you become pregnant.  Ask for birth control (contraception) if you want to prevent pregnancy. Osteoporosis and menopause Osteoporosis is a disease in which the bones lose minerals and strength with aging. This can result in bone fractures. If you are 65 years old or older, or if you are at risk for osteoporosis and fractures, ask your health care provider if you should:  Be screened for bone loss.  Take a calcium or vitamin D supplement to lower your risk of fractures.  Be given hormone replacement therapy (HRT) to treat symptoms of menopause. Follow these instructions at home: Lifestyle  Do not use any products that contain nicotine or tobacco, such as cigarettes, e-cigarettes, and chewing tobacco. If you need help quitting, ask your health care provider.  Do not use street drugs.  Do not share needles.  Ask your health care provider for help if you need support or information about quitting drugs. Alcohol use  Do not drink alcohol if: ? Your health care provider tells you not to drink. ? You are pregnant, may be pregnant, or are planning to become pregnant.  If you drink alcohol: ? Limit how much you use to 0-1 drink a day. ? Limit intake if you are breastfeeding.  Be aware of how much alcohol is in your drink. In the [place] one drink equals one 12 oz bottle of beer (355 mL), one 5 oz glass of wine (148 mL), or one 1 oz glass of hard liquor (44 mL). General instructions  Schedule regular health, dental, and eye exams.  Stay current with your vaccines.  Tell your health care provider if: ? You often feel depressed. ? You have ever been abused or do not feel safe at home. Summary  Adopting a healthy lifestyle and getting preventive care are important in promoting health and wellness.  Follow your health care provider's instructions about healthy  diet, exercising, and getting tested or screened for diseases.  Follow your health care provider's instructions on monitoring your cholesterol and blood pressure. This information is not intended to replace advice given to you by your health care provider. Make sure you discuss any questions you have with your health care provider. Document Released: 01/05/2011 Document Revised: 06/15/2018 Document Reviewed: 06/15/2018 Elsevier Patient Education  2020 Elsevier Inc.  

## 2019-03-27 ENCOUNTER — Ambulatory Visit: Payer: 59 | Admitting: Physician Assistant

## 2019-03-27 ENCOUNTER — Ambulatory Visit
Admission: RE | Admit: 2019-03-27 | Discharge: 2019-03-27 | Disposition: A | Discharge status: Home / Self Care | Payer: 59 | Source: Ambulatory Visit | Attending: Physician Assistant | Admitting: Physician Assistant

## 2019-03-27 ENCOUNTER — Encounter: Payer: Self-pay | Admitting: Physician Assistant

## 2019-03-27 ENCOUNTER — Other Ambulatory Visit: Payer: Self-pay

## 2019-03-27 VITALS — BP 122/80 | HR 95 | Temp 97.3°F | Wt 253.0 lb

## 2019-03-27 DIAGNOSIS — M25521 Pain in right elbow: Secondary | ICD-10-CM | POA: Diagnosis not present

## 2019-03-27 DIAGNOSIS — M25561 Pain in right knee: Secondary | ICD-10-CM | POA: Insufficient documentation

## 2019-03-27 NOTE — Patient Instructions (Signed)
Elbow Contusion An elbow contusion is a deep bruise of the elbow. Deep bruises happen when an injury causes bleeding under the skin. The skin over the deep bruise may turn blue, purple, or yellow.  Minor injuries will give you a bruise that is painless. Deep bruises that are worse may stay painful and swollen for a few weeks. What are the causes? Common causes of this condition include:  A hard hit to the elbow.  An injury (trauma) to the elbow.  Direct force on the elbow, such as from a fall. What increases the risk? You are more likely to develop this condition if you:  Play sports or do other physical activities.  Use blood thinners. What are the signs or symptoms? Symptoms of this condition include:  Swelling of the elbow.  Pain and tenderness of the elbow.  Change in skin color at the elbow. The area may have redness and then turn blue, purple, or yellow. How is this treated? This condition may be treated with:  Rest, ice, pressure (compression), and elevation. This is often called RICE therapy.  A sling or splint to support your injury.  Over-the-counter medicines, such as ibuprofen, for pain control.  Range-of-motion exercises. Follow these instructions at home: RICE therapy   Rest the injured area.  If told, put ice on the injured area: ? If you have a removable sling or splint, remove it as told by your doctor. ? Put ice in a plastic bag. ? Place a towel between your skin and the bag. ? Leave the ice on for 20 minutes, 2-3 times a day.  If told, put light pressure (compression) on the injured area using an elastic bandage. ? Make sure the bandage is not wrapped too tightly. ? Remove and reapply the bandage as told by your doctor.  Raise (elevate) the injured area above the level of your heart while you are sitting or lying down. If you have a sling or splint:   Wear the sling or splint as told by your doctor. Remove it only as told by your doctor.   Loosen the sling or splint if your fingers: ? Tingle. ? Become numb. ? Turn cold and blue.  Keep the sling or splint clean.  If the sling or splint is not waterproof: ? Do not let it get wet. ? Cover it with a watertight covering when you take a bath or a shower. General instructions  Take over-the-counter and prescription medicines only as told by your doctor.  Return to your normal activities as told by your doctor. Ask your doctor what activities are safe for you.  Do range-of-motion exercises only as told by your doctor.  Ask your doctor when it is safe to drive if you have a sling or splint on your arm.  Wear elbow pads as told by your doctor.  Keep all follow-up visits as told by your doctor. This is important. Contact a doctor if:  Your symptoms do not get better after many days of treatment.  You have more redness, swelling, or pain in your elbow.  You have trouble moving the injured area.  Medicine does not help your pain or swelling. Get help right away if:  Your skin over the bruise breaks and starts to bleed.  You have very bad pain.  You have numbness in your hand or fingers.  Your hand or fingers turn very light (pale) or cold.  You have swelling of your hand and fingers.  You cannot move   your fingers or wrist. Summary  An elbow contusion is a deep bruise of the elbow.  The skin over the deep bruise may turn blue, purple, or yellow.  Rest the injured area and put ice on the area as told by your doctor.  If told, put light pressure on the injured area using an elastic bandage.  Raise (elevate) the injured area above the level of your heart while you are sitting or lying down. This information is not intended to replace advice given to you by your health care provider. Make sure you discuss any questions you have with your health care provider. Document Released: 06/11/2011 Document Revised: 12/23/2017 Document Reviewed: 12/23/2017 Elsevier  Patient Education  2020 Elsevier Inc.  

## 2019-03-27 NOTE — Progress Notes (Signed)
Patient: Allison Moss Female    DOB: 11/02/1980   38 y.o.   MRN: [id number] Visit Date: 03/27/2019  Today's Provider: Trey SailorsAdriana M Pollak, PA-C   Chief Complaint  Patient presents with  . Fall   Subjective:     Fall The accident occurred 3 to 5 days ago. Fall occurred: Pt reports she fell down the stairs. The point of impact was the buttocks and right elbow (Back). The pain is present in the right elbow and right knee. Pertinent negatives include no fever, headaches, hearing loss, loss of consciousness, nausea or visual change. She has tried ice and heat for the symptoms. The treatment provided mild relief.   Fell onto wooden stairs 03/24/2019. Her right elbow hurts and her right knee hurts. No LOC, did not hit head  Allergies  Allergen Reactions  . Acetaminophen-Codeine Itching  . Escitalopram   . Hydrocodone-Acetaminophen Nausea Only  . Propoxyphene Hives  . Tramadol Hives     Current Outpatient Medications:  .  fluticasone (FLONASE) 50 MCG/ACT nasal spray, Place 2 sprays into both nostrils daily., Disp: 16 g, Rfl: 6 .  Glucos-Chond-Hyal Ac-Ca Fructo (MOVE FREE JOINT HEALTH ADVANCE) TABS, Take by mouth., Disp: , Rfl:  .  Omega-3 Fatty Acids (FISH OIL) 1000 MG CAPS, Take by mouth., Disp: , Rfl:  .  phentermine (ADIPEX-P) 37.5 MG tablet, Take 1 tablet (37.5 mg total) by mouth daily before breakfast., Disp: 30 tablet, Rfl: 2 .  TURMERIC PO, Take by mouth., Disp: , Rfl:  .  neomycin-polymyxin-hydrocortisone (CORTISPORIN) OTIC solution, Place 3 drops into both ears 2 (two) times a day. X 5-7 days (Patient not taking: Reported on 03/27/2019), Disp: 10 mL, Rfl: 0  Review of Systems  Constitutional: Negative.  Negative for fever.  Gastrointestinal: Negative for nausea.  Musculoskeletal: Positive for arthralgias and joint swelling. Negative for back pain, gait problem, myalgias, neck pain and neck stiffness.  Neurological: Negative for dizziness, loss of consciousness,  light-headedness and headaches.    Social History   Tobacco Use  . Smoking status: Current Some Day Smoker    Packs/day: 1.00    Types: Cigarettes  . Smokeless tobacco: Never Used  . Tobacco comment: cuts back to 2 cigs daily   Substance Use Topics  . Alcohol use: No    Alcohol/week: 0.0 standard drinks      Objective:   BP 122/80 (BP Location: Left Arm, Patient Position: Sitting, Cuff Size: Large)   Pulse 95   Temp (!) 97.3 F (36.3 C) (Temporal)   Wt 253 lb (114.8 kg)   SpO2 98%   BMI 37.91 kg/m  Vitals:   03/27/19 1416  BP: 122/80  Pulse: 95  Temp: (!) 97.3 F (36.3 C)  TempSrc: Temporal  SpO2: 98%  Weight: 253 lb (114.8 kg)  Body mass index is 37.91 kg/m.   Physical Exam Constitutional:      Appearance: Normal appearance.  Musculoskeletal:     Right elbow: She exhibits no swelling and no deformity. Tenderness found. Olecranon process tenderness noted.     Right knee: Normal.  Skin:    General: Skin is warm and dry.  Neurological:     Mental Status: She is alert and oriented to person, place, and time. Mental status is at baseline.  Psychiatric:        Mood and Affect: Mood normal.        Behavior: Behavior normal.      No results found for any  visits on 03/27/19.     Assessment & Plan    1. Right elbow pain  Xray shows chip fracture and possible second fracture. She will be seen at [facility] walk in clinic. May use NSAIDs for relief.  - DG Elbow Complete Right; Future - DG Knee Complete 4 Views Right; Future  2. Acute pain of right knee  - DG Elbow Complete Right; Future - DG Knee Complete 4 Views Right; Future  The entirety of the information documented in the History of Present Illness, Review of Systems and Physical Exam were personally obtained by me. Portions of this information were initially documented by Lynford Humphrey, CMA and reviewed by me for thoroughness and accuracy.   F/u PRN    Trinna Post, PA-C  [facility]

## 2019-03-28 ENCOUNTER — Telehealth: Payer: Self-pay | Admitting: Physician Assistant

## 2019-03-28 NOTE — Telephone Encounter (Signed)
Pt wanting to know if she can be sent to [facility] due to her fractured elbow. Call on work number [phone] if before 12 noon.  Thanks, [employer]

## 2019-03-28 NOTE — Telephone Encounter (Signed)
Yes, of course she may do that. The fastest way to do that would be to go to their walk in urgent care hours which are from 1:30 to 7:30 PM. She does not need an appointment and they should be able to see the xrays we took yesterday.

## 2019-03-28 NOTE — Telephone Encounter (Signed)
Please review

## 2019-03-28 NOTE — Telephone Encounter (Signed)
Pt advised.   Thanks,   -Laura  

## 2019-04-05 ENCOUNTER — Other Ambulatory Visit (HOSPITAL_COMMUNITY): Payer: Self-pay | Admitting: Physician Assistant

## 2019-04-05 ENCOUNTER — Other Ambulatory Visit: Payer: Self-pay | Admitting: Physician Assistant

## 2019-04-05 DIAGNOSIS — S42402A Unspecified fracture of lower end of left humerus, initial encounter for closed fracture: Secondary | ICD-10-CM

## 2019-04-05 DIAGNOSIS — S52001A Unspecified fracture of upper end of right ulna, initial encounter for closed fracture: Secondary | ICD-10-CM

## 2019-04-06 ENCOUNTER — Other Ambulatory Visit: Payer: Self-pay | Admitting: Physician Assistant

## 2019-04-06 DIAGNOSIS — S52001A Unspecified fracture of upper end of right ulna, initial encounter for closed fracture: Secondary | ICD-10-CM

## 2019-04-10 ENCOUNTER — Ambulatory Visit
Admission: RE | Admit: 2019-04-10 | Discharge: 2019-04-10 | Disposition: A | Discharge status: Home / Self Care | Payer: 59 | Source: Ambulatory Visit | Attending: Physician Assistant | Admitting: Physician Assistant

## 2019-04-10 ENCOUNTER — Other Ambulatory Visit: Payer: Self-pay

## 2019-04-10 DIAGNOSIS — S52001A Unspecified fracture of upper end of right ulna, initial encounter for closed fracture: Secondary | ICD-10-CM | POA: Insufficient documentation

## 2019-07-11 ENCOUNTER — Telehealth: Payer: Self-pay
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
## 2019-07-13 ENCOUNTER — Ambulatory Visit: Payer: 59 | Attending: Internal Medicine

## 2019-07-13 DIAGNOSIS — Z20822 Contact with and (suspected) exposure to covid-19: Secondary | ICD-10-CM

## 2019-07-13 NOTE — Telephone Encounter (Signed)
Yes that is correct 5-7 days.

## 2019-07-13 NOTE — Telephone Encounter (Signed)
Please advise. Is it 5 days after exposure?

## 2019-07-13 NOTE — Telephone Encounter (Signed)
Advised-patient states she has already been tested

## 2019-07-15 LAB — NOVEL CORONAVIRUS, NAA: SARS-CoV-2, NAA: NOT DETECTED

## 2020-06-10 ENCOUNTER — Other Ambulatory Visit: Payer: Self-pay

## 2020-06-10 DIAGNOSIS — R1013 Epigastric pain: Secondary | ICD-10-CM | POA: Insufficient documentation

## 2020-06-10 DIAGNOSIS — Z5321 Procedure and treatment not carried out due to patient leaving prior to being seen by health care provider: Secondary | ICD-10-CM | POA: Diagnosis not present

## 2020-06-10 DIAGNOSIS — R110 Nausea: Secondary | ICD-10-CM | POA: Diagnosis not present

## 2020-06-10 LAB — COMPREHENSIVE METABOLIC PANEL
ALT: 15 U/L (ref 0–44)
AST: 15 U/L (ref 15–41)
Albumin: 4.2 g/dL (ref 3.5–5.0)
Alkaline Phosphatase: 73 U/L (ref 38–126)
Anion gap: 9 (ref 5–15)
BUN: 11 mg/dL (ref 6–20)
CO2: 23 mmol/L (ref 22–32)
Calcium: 9.2 mg/dL (ref 8.9–10.3)
Chloride: 106 mmol/L (ref 98–111)
Creatinine, Ser: 0.82 mg/dL (ref 0.44–1.00)
GFR, Estimated: >60 mL/min (ref >60)
Glucose, Bld: 138 mg/dL — ABNORMAL HIGH (ref 70–99)
Potassium: 3.6 mmol/L (ref 3.5–5.1)
Sodium: 138 mmol/L (ref 135–145)
Total Bilirubin: 0.6 mg/dL (ref 0.3–1.2)
Total Protein: 7 g/dL (ref 6.5–8.1)

## 2020-06-10 LAB — CBC
HCT: 36 % (ref 36.0–46.0)
Hemoglobin: 12.2 g/dL (ref 12.0–15.0)
MCH: 32.4 pg (ref 26.0–34.0)
MCHC: 33.9 g/dL (ref 30.0–36.0)
MCV: 95.7 fL (ref 80.0–100.0)
Platelets: 294 10*3/uL (ref 150–400)
RBC: 3.76 MIL/uL — ABNORMAL LOW (ref 3.87–5.11)
RDW: 14.3 % (ref 11.5–15.5)
WBC: 10.2 10*3/uL (ref 4.0–10.5)
nRBC: 0 % (ref 0.0–0.2)

## 2020-06-10 LAB — LIPASE, BLOOD: Lipase: 25 U/L (ref 11–51)

## 2020-06-10 LAB — TROPONIN I (HIGH SENSITIVITY): Troponin I (High Sensitivity): <2 ng/L (ref <18)

## 2020-06-10 NOTE — ED Triage Notes (Signed)
Pt in with co epigastric pain no hx of the same. Took tagemet without relief. Pt denies any vomiting, does have nausea.

## 2020-06-11 ENCOUNTER — Ambulatory Visit (INDEPENDENT_AMBULATORY_CARE_PROVIDER_SITE_OTHER): Payer: No Typology Code available for payment source | Admitting: Family Medicine

## 2020-06-11 ENCOUNTER — Encounter: Payer: Self-pay | Admitting: Family Medicine

## 2020-06-11 ENCOUNTER — Emergency Department
Admission: EM | Admit: 2020-06-11 | Discharge: 2020-06-11 | Disposition: A | Discharge status: Home / Self Care | Payer: No Typology Code available for payment source | Attending: Emergency Medicine | Admitting: Emergency Medicine

## 2020-06-11 ENCOUNTER — Emergency Department
Admission: RE | Admit: 2020-06-11 | Discharge: 2020-06-11 | Disposition: A | Discharge status: Home / Self Care | Payer: No Typology Code available for payment source | Source: Ambulatory Visit | Attending: Family Medicine | Admitting: Family Medicine

## 2020-06-11 ENCOUNTER — Ambulatory Visit: Payer: No Typology Code available for payment source

## 2020-06-11 VITALS — BP 122/75 | HR 81 | Temp 98.4°F | Resp 16 | Wt 249.0 lb

## 2020-06-11 DIAGNOSIS — R8281 Pyuria: Secondary | ICD-10-CM | POA: Insufficient documentation

## 2020-06-11 DIAGNOSIS — R1011 Right upper quadrant pain: Secondary | ICD-10-CM | POA: Insufficient documentation

## 2020-06-11 LAB — POCT URINALYSIS DIPSTICK
Bilirubin, UA: NEGATIVE
Glucose, UA: NEGATIVE
Ketones, UA: NEGATIVE
Nitrite, UA: POSITIVE
Protein, UA: POSITIVE — AB
Spec Grav, UA: 1.025 (ref 1.010–1.025)
Urobilinogen, UA: 0.2 E.U./dL
pH, UA: 6 (ref 5.0–8.0)

## 2020-06-11 MED ORDER — CIPROFLOXACIN HCL 500 MG PO TABS: 500.0000 mg | ORAL_TABLET | Freq: Two times a day (BID) | ORAL | 0 refills | Status: DC

## 2020-06-11 NOTE — Addendum Note (Signed)
Addended by: Dortha Kern E on: 06/11/2020 02:37 PM   Modules accepted: Orders

## 2020-06-11 NOTE — Progress Notes (Signed)
Established patient visit   Patient: Allison Moss   DOB: 01-Oct-1980   39 y.o. Female  MRN: [id number] Visit Date: 06/11/2020  Today's healthcare provider: Dortha Kern, PA   Chief Complaint  Patient presents with  . Abdominal Pain   Subjective    HPI  Patient presents today with upper abdominal pain. She reports that she has had symptoms X 1-2 days. She describes it as a dull ache that radiates to the right side of her back. She denies vomiting or diarrhea. She does feel nauseous. She reports that she went to the ER last night, but left before being seen. She reports that they told her her labs and EKG was WNL.     Past Medical History:  Diagnosis Date  . Arthritis    Rheumatoid and Psoriatic   Past Surgical History:  Procedure Laterality Date  . APPENDECTOMY    . CESAREAN SECTION     x2  . KNEE ARTHROSCOPY Left    Social History   Tobacco Use  . Smoking status: Current Some Day Smoker    Packs/day: 1.00    Types: Cigarettes  . Smokeless tobacco: Never Used  . Tobacco comment: cuts back to 2 cigs daily   Vaping Use  . Vaping Use: Never used  Substance Use Topics  . Alcohol use: No    Alcohol/week: 0.0 standard drinks  . Drug use: No   Family History  Problem Relation Age of Onset  . Hypothyroidism Mother   . Lung cancer Maternal Grandmother   . Hypertension Maternal Grandfather   . Arrhythmia Maternal Grandfather    Allergies  Allergen Reactions  . Acetaminophen-Codeine Itching  . Escitalopram   . Hydrocodone-Acetaminophen Nausea Only  . Propoxyphene Hives  . Tramadol Hives       Medications: Outpatient Medications Prior to Visit  Medication Sig  . fluticasone (FLONASE) 50 MCG/ACT nasal spray Place 2 sprays into both nostrils daily.  . Glucos-Chond-Hyal Ac-Ca Fructo (MOVE FREE JOINT HEALTH ADVANCE) TABS Take by mouth.  . Omega-3 Fatty Acids (FISH OIL) 1000 MG CAPS Take by mouth.  . phentermine (ADIPEX-P) 37.5 MG tablet Take 1 tablet  (37.5 mg total) by mouth daily before breakfast.  . TURMERIC PO Take by mouth.  . neomycin-polymyxin-hydrocortisone (CORTISPORIN) OTIC solution Place 3 drops into both ears 2 (two) times a day. X 5-7 days (Patient not taking: Reported on 03/27/2019)   No facility-administered medications prior to visit.    Review of Systems  Constitutional: Negative.   Respiratory: Negative for cough.   Cardiovascular: Negative.   Gastrointestinal: Positive for abdominal pain and nausea. Negative for abdominal distention, anal bleeding, blood in stool, constipation, diarrhea, rectal pain and vomiting.  Musculoskeletal: Negative for arthralgias and myalgias.  Skin: Negative.   Neurological: Negative for dizziness, light-headedness and headaches.      Objective    BP 122/75   Pulse 81   Temp 98.4 F (36.9 C)   Resp 16   Wt 249 lb (112.9 kg)   LMP 06/03/2020 (Exact Date)   BMI 36.77 kg/m  LMP 1 week ago.   Physical Exam Constitutional:      General: She is not in acute distress.    Appearance: She is well-developed.  HENT:     Head: Normocephalic and atraumatic.     Right Ear: Hearing normal.     Left Ear: Hearing normal.     Nose: Nose normal.  Eyes:     General: Lids are normal.  No scleral icterus.       Right eye: No discharge.        Left eye: No discharge.     Conjunctiva/sclera: Conjunctivae normal.  Cardiovascular:     Rate and Rhythm: Normal rate and regular rhythm.     Heart sounds: Normal heart sounds.  Pulmonary:     Effort: Pulmonary effort is normal. No respiratory distress.  Abdominal:     Tenderness: There is abdominal tenderness in the right upper quadrant and suprapubic area. There is guarding.  Musculoskeletal:        General: Normal range of motion.  Skin:    Findings: No lesion or rash.  Neurological:     Mental Status: She is alert and oriented to person, place, and time.  Psychiatric:        Speech: Speech normal.        Behavior: Behavior normal.         Thought Content: Thought content normal.      Results for orders placed or performed during the hospital encounter of 06/11/20  CBC  Result Value Ref Range   WBC 10.2 4.0 - 10.5 K/uL   RBC 3.76 (L) 3.87 - 5.11 MIL/uL   Hemoglobin 12.2 12.0 - 15.0 g/dL   HCT 10.6 36 - 46 %   MCV 95.7 80.0 - 100.0 fL   MCH 32.4 26.0 - 34.0 pg   MCHC 33.9 30.0 - 36.0 g/dL   RDW 26.9 48.5 - 46.2 %   Platelets 294 150 - 400 K/uL   nRBC 0.0 0.0 - 0.2 %  Comprehensive metabolic panel  Result Value Ref Range   Sodium 138 135 - 145 mmol/L   Potassium 3.6 3.5 - 5.1 mmol/L   Chloride 106 98 - 111 mmol/L   CO2 23 22 - 32 mmol/L   Glucose, Bld 138 (H) 70 - 99 mg/dL   BUN 11 6 - 20 mg/dL   Creatinine, Ser 7.03 0.44 - 1.00 mg/dL   Calcium 9.2 8.9 - 50.0 mg/dL   Total Protein 7.0 6.5 - 8.1 g/dL   Albumin 4.2 3.5 - 5.0 g/dL   AST 15 15 - 41 U/L   ALT 15 0 - 44 U/L   Alkaline Phosphatase 73 38 - 126 U/L   Total Bilirubin 0.6 0.3 - 1.2 mg/dL   GFR, Estimated >93 >81 mL/min   Anion gap 9 5 - 15  Lipase, blood  Result Value Ref Range   Lipase 25 11 - 51 U/L  Troponin I (High Sensitivity)  Result Value Ref Range   Troponin I (High Sensitivity) <2 <18 ng/L    Assessment & Plan     1. RUQ pain Onset yesterday with some nausea but no fever. Went to the ER,but, did not wait for complete evaluation. Lipase, CBC and CMP were normal. Urinalysis today shows nitrites and leukocytes. Still tender in the RUQ to palpation with slight guarding. No rigidity. Will schedule for stat ultrasound to evaluate for GB disease and renal stones. Treat with antibiotic, extra fluids and bland low fat diet. May need surgical referral pending reports or should go back to ER if worsening pain. - POCT urinalysis dipstick - Urine Culture - US Abdomen Complete - ciprofloxacin (CIPRO) 500 MG tablet; Take 1 tablet (500 mg total) by mouth 2 (two) times daily.  Dispense: 20 tablet; Refill: 0  2. Pyuria Slight tenderness of bladder and  questionable CVA tenderness to percussion posteriorly. With epigastric and RUQ tenderness, will get stat  abdominal ultrasound, urine C&S and start antibiotic treatment. Increase fluids and follow up pending reports. LMP 1 week ago. History of 2 C-sections and appendectomy. - US Abdomen Complete - ciprofloxacin (CIPRO) 500 MG tablet; Take 1 tablet (500 mg total) by mouth 2 (two) times daily.  Dispense: 20 tablet; Refill: 0   No follow-ups on file.         Dortha Kern, PA  [facility] [phone] (phone) [phone] (fax)  [facility]

## 2020-06-12 ENCOUNTER — Telehealth: Payer: Self-pay

## 2020-06-12 DIAGNOSIS — K811 Chronic cholecystitis: Secondary | ICD-10-CM

## 2020-06-12 DIAGNOSIS — R1011 Right upper quadrant pain: Secondary | ICD-10-CM

## 2020-06-12 NOTE — Telephone Encounter (Signed)
Copied from CRM [id number]. Topic: Quick Communication - Lab Results (Clinic Use ONLY) >> Jun 12, 2020 11:01 AM Crist Infante wrote: Pt would like a call back to go over [place] results.

## 2020-06-12 NOTE — Telephone Encounter (Signed)
Advised patient of results. Patient would like to go to Dr. Birdie Sons.

## 2020-06-12 NOTE — Telephone Encounter (Signed)
U/S results reviewed with pt.  (See phone note 06/12/2020)  Thanks,   -Vernona Rieger

## 2020-06-12 NOTE — Telephone Encounter (Signed)
-----   Message from Tamsen Roers, [place] sent at 06/12/2020  9:05 AM EST ----- Abdominal ultrasound negative for kidney or urinary bladder stones. Multiple gall stones with some thickening of the gallbladder wall. Recommend she continue a low fat diet with increase in fluids and schedule referral to a general surgeon with diagnosis of chronic cholelithiasis and RUQ abdominal pain.

## 2020-06-15 LAB — URINE CULTURE

## 2020-06-22 IMAGING — CT CT ELBOW*R* W/O CM
4 of 9 series · 13 of 33 positions shown, 14 images · non-contrast
Comparison: Plain films right elbow 03/27/2019.

CLINICAL DATA: The patient suffered a right elbow injury in a fall
down steps 03/24/2019. Subsequent encounter.

EXAM:
CT OF THE LOWER RIGHT EXTREMITY WITHOUT CONTRAST
TECHNIQUE: Multidetector CT imaging of the right lower extremity was performed
according to the standard protocol.

[Series 11: sag bone · axial · 0.24mm/px · z∈[-443,-411]mm · 3 of 81 slices shown]
[im 21/81  bone]
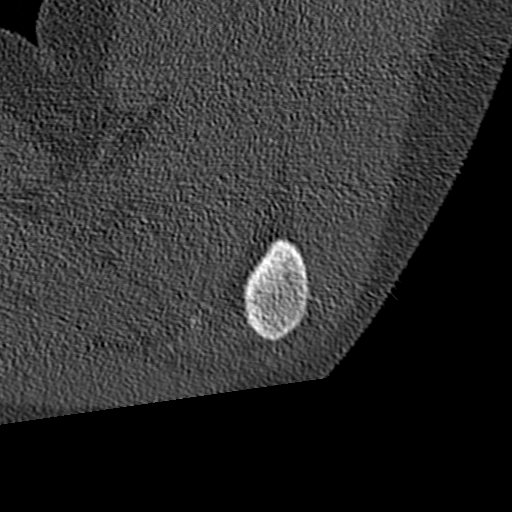
[im 41/81  bone]
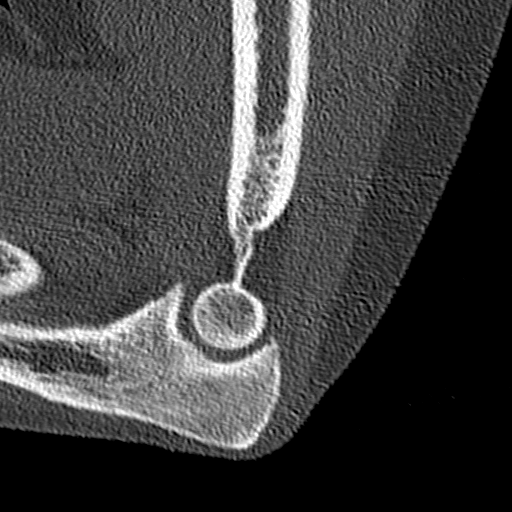
[im 61/81  bone]
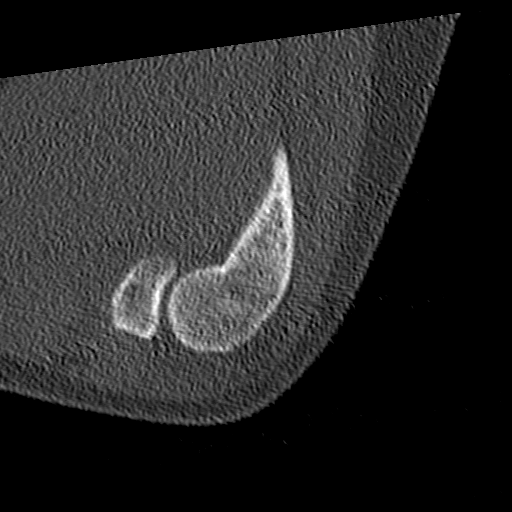

[Series 14: cor st · sagittal · 0.24mm/px · 5 of 124 slices shown]
[im 21/124  bone]
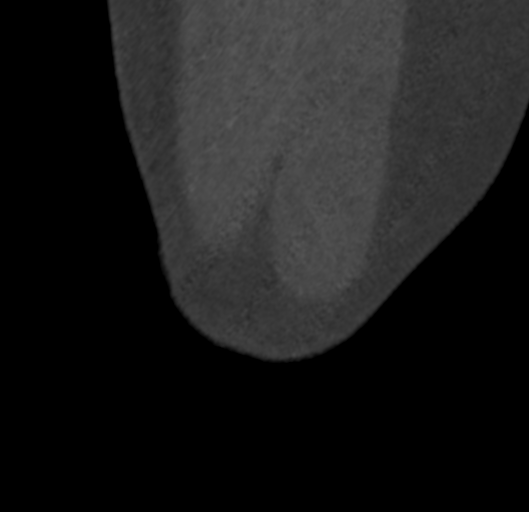
[im 42/124  bone]
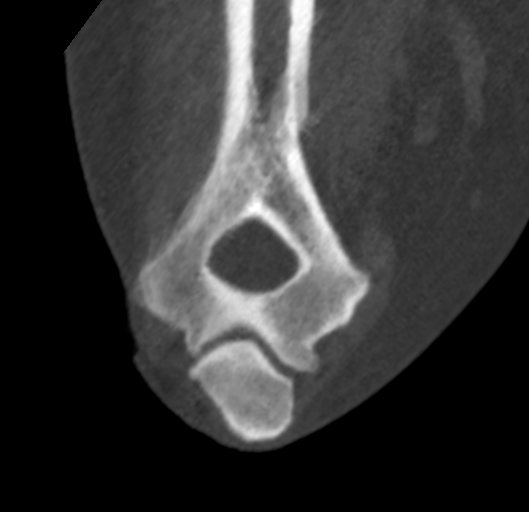
[im 62/124  bone]
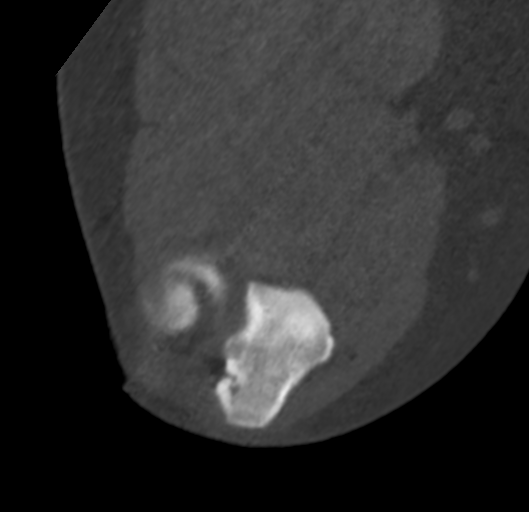
[im 83/124  bone]
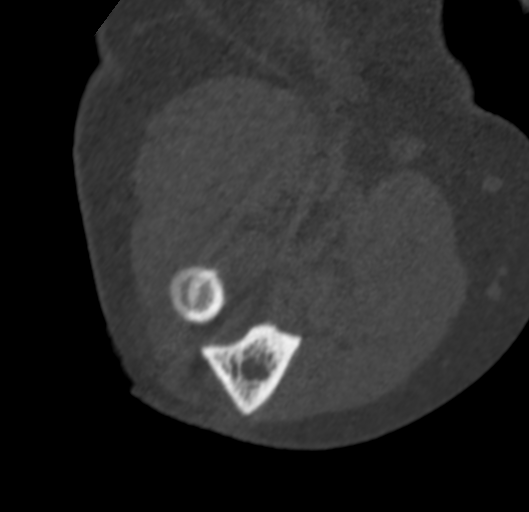
[im 103/124  bone]
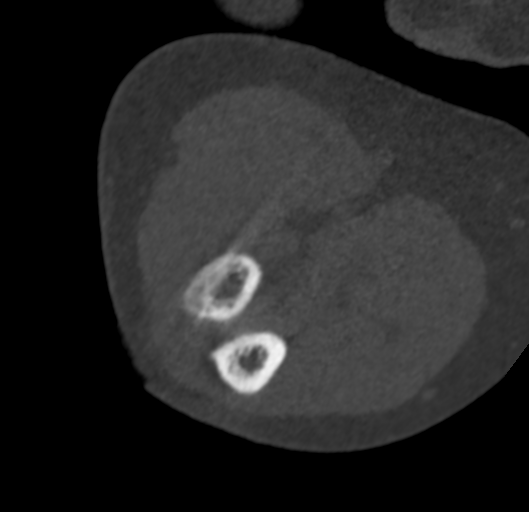

[Series 16: sag st · axial · 0.24mm/px · z∈[-436,-416]mm · 2 of 76 slices shown (1 of 2)]
[im 26/76  bone]
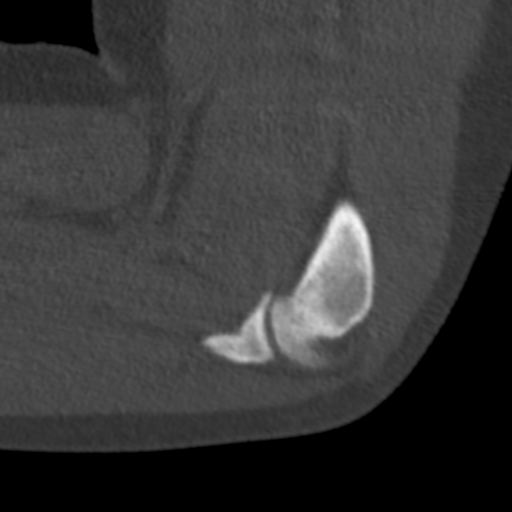
[im 51/76  bone]
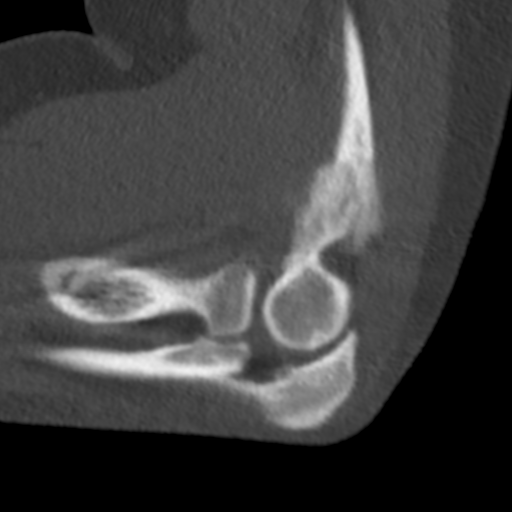

[Series 17: sag st · axial · 0.24mm/px · z∈[-443,-411]mm · 3 of 82 slices shown, 4 images (2 of 2)]
[im 21/82  soft-tissue]
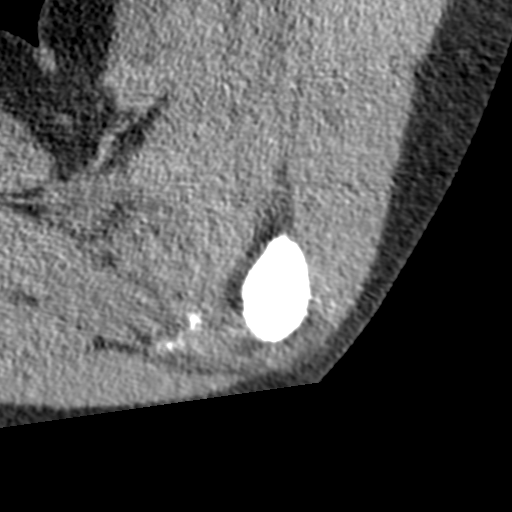
[im 21/82  bone]
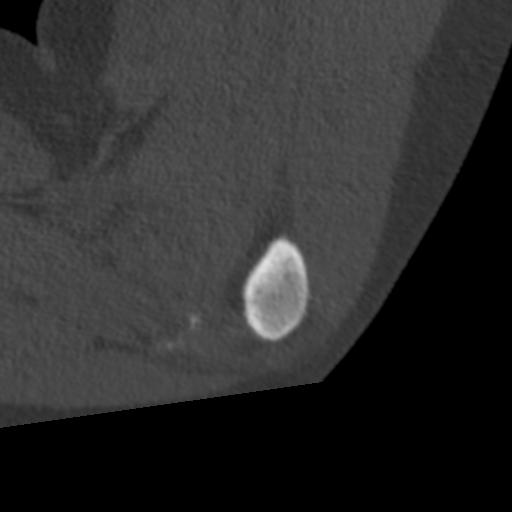
[im 41/82  bone]
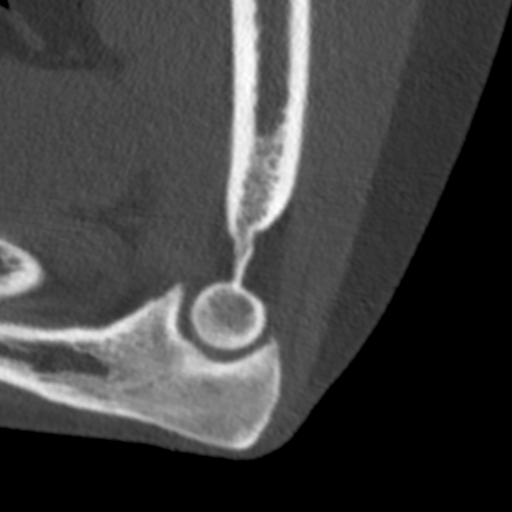
[im 61/82  bone]
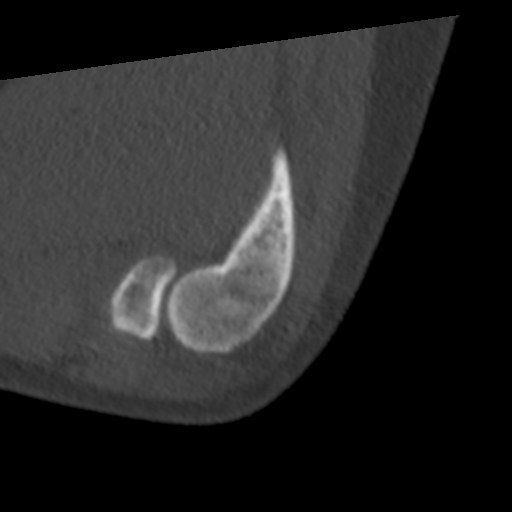

[13 of 33 positions shown; findings below may reference images not displayed]

FINDINGS: Bones/Joint/Cartilage

There is no acute bony or joint abnormality. In particular, the tiny
fragment off the coronoid process seen on plain films is not
visualized on this examination. A loose body measuring 0.8 cm
craniocaudal by 0.3 cm AP by 0.6 cm transverse is seen anterior to
the lateral periphery of the olecranon is on image 57 of series 10.
No donor site is identified. There is no elbow joint effusion.
Minimal osteophytosis off the coronoid process is identified.

Ligaments

Suboptimally assessed by CT.

Muscles and Tendons

Appear normal.

Soft tissues

Negative.  No fluid collection or mass.
IMPRESSION: Negative for fracture.  No acute abnormality.

Loose body anterior to the lateral periphery of the olecranon
measuring 0.8 x 0.3 x 0.6 cm. Mild osteophytosis off the coronoid
process also noted.

## 2020-07-11 ENCOUNTER — Other Ambulatory Visit: Payer: Self-pay | Admitting: General Surgery
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
## 2020-07-19 ENCOUNTER — Encounter
Admission: RE | Admit: 2020-07-19 | Discharge: 2020-07-19 | Disposition: A | Discharge status: Home / Self Care | Payer: No Typology Code available for payment source | Source: Ambulatory Visit | Attending: General Surgery | Admitting: General Surgery

## 2020-07-19 ENCOUNTER — Other Ambulatory Visit: Payer: Self-pay

## 2020-07-19 HISTORY — DX: Unspecified asthma, uncomplicated: J45.909

## 2020-07-19 NOTE — Patient Instructions (Signed)
Your procedure is scheduled on: 07/26/20 Report to DAY SURGERY DEPARTMENT LOCATED ON 2ND FLOOR MEDICAL MALL ENTRANCE after check in at the Admitting Desk To find out your arrival time please call [phone] between 1PM - 3PM on 07/25/20.  Remember: Instructions that are not followed completely may result in serious medical risk, up to and including death, or upon the discretion of your surgeon and anesthesiologist your surgery may need to be rescheduled.     _X__ 1. Do not eat food after midnight the night before your procedure.                 No gum chewing or hard candies. You may drink clear liquids up to 2 hours                 before you are scheduled to arrive for your surgery- DO not drink clear                 liquids within 2 hours of the start of your surgery.                 Clear Liquids include:  water, apple juice without pulp, clear carbohydrate                 drink such as Clearfast or Gatorade, Black Coffee or Tea (Do not add                 anything to coffee or tea). Diabetics water only  __X__2.  On the morning of surgery brush your teeth with toothpaste and water, you                 may rinse your mouth with mouthwash if you wish.  Do not swallow any              toothpaste of mouthwash.     _X__ 3.  No Alcohol for 24 hours before or after surgery.   _X__ 4.  Do Not Smoke or use e-cigarettes For 24 Hours Prior to Your Surgery.                 Do not use any chewable tobacco products for at least 6 hours prior to                 surgery.  ____  5.  Bring all medications with you on the day of surgery if instructed.   __X__  6.  Notify your doctor if there is any change in your medical condition      (cold, fever, infections).     Do not wear jewelry, make-up, hairpins, clips or nail polish. Do not wear lotions, powders, or perfumes.  Do not shave 48 hours prior to surgery. Men may shave face and neck. Do not bring valuables to the hospital.    [facility] is  not responsible for any belongings or valuables.  Contacts, dentures/partials or body piercings may not be worn into surgery. Bring a case for your contacts, glasses or hearing aids, a denture cup will be supplied. Leave your suitcase in the car. After surgery it may be brought to your room. For patients admitted to the hospital, discharge time is determined by your treatment team.   Patients discharged the day of surgery will not be allowed to drive home.   Please read over the following fact sheets that you were given:   CHG soap  __X__ Take these medicines the morning of surgery  with A SIP OF WATER:    1. none  2.   3.   4.  5.  6.  ____ Fleet Enema (as directed)   __X__ Use CHG Soap/SAGE wipes as directed  __X__ Use inhalers on the day of surgery  ____ Stop metformin/Janumet/Farxiga 2 days prior to surgery    ____ Take 1/2 of usual insulin dose the night before surgery. No insulin the morning          of surgery.   ____ Stop Blood Thinners Coumadin/Plavix/Xarelto/Pleta/Pradaxa/Eliquis/Effient/Aspirin  on   Or contact your Surgeon, Cardiologist or Medical Doctor regarding  ability to stop your blood thinners  __X__ Stop Anti-inflammatories 7 days before surgery such as Advil, Ibuprofen, Motrin,  BC or Goodies Powder, Naprosyn, Naproxen, Aleve, Aspirin    __X__ Stop all herbal supplements, fish oil or vitamin E until after surgery.    ____ Bring C-Pap to the hospital.

## 2020-07-24 ENCOUNTER — Other Ambulatory Visit: Admission: RE | Admit: 2020-07-24 | Payer: No Typology Code available for payment source | Source: Ambulatory Visit

## 2020-07-26 ENCOUNTER — Encounter: Admission: RE | Payer: Self-pay | Source: Home / Self Care

## 2020-07-26 ENCOUNTER — Ambulatory Visit
Admission: RE | Admit: 2020-07-26 | Payer: No Typology Code available for payment source | Source: Home / Self Care | Admitting: General Surgery

## 2020-07-26 SURGERY — LAPAROSCOPIC CHOLECYSTECTOMY WITH INTRAOPERATIVE CHOLANGIOGRAM
Anesthesia: General

## 2020-09-05 ENCOUNTER — Encounter: Payer: Self-pay | Admitting: Adult Health

## 2020-09-05 ENCOUNTER — Other Ambulatory Visit: Payer: Self-pay

## 2020-09-05 ENCOUNTER — Ambulatory Visit (INDEPENDENT_AMBULATORY_CARE_PROVIDER_SITE_OTHER): Payer: No Typology Code available for payment source | Admitting: Adult Health

## 2020-09-05 VITALS — BP 150/99 | HR 80 | Resp 16 | Wt 231.4 lb

## 2020-09-05 DIAGNOSIS — F439 Reaction to severe stress, unspecified: Secondary | ICD-10-CM | POA: Diagnosis not present

## 2020-09-05 DIAGNOSIS — R4589 Other symptoms and signs involving emotional state: Secondary | ICD-10-CM | POA: Diagnosis not present

## 2020-09-05 DIAGNOSIS — F419 Anxiety disorder, unspecified: Secondary | ICD-10-CM | POA: Insufficient documentation

## 2020-09-05 DIAGNOSIS — F4321 Adjustment disorder with depressed mood: Secondary | ICD-10-CM | POA: Diagnosis not present

## 2020-09-05 MED ORDER — SERTRALINE HCL 50 MG PO TABS: 50.0000 mg | ORAL_TABLET | Freq: Every day | ORAL | 1 refills | Status: DC

## 2020-09-05 MED ORDER — ALPRAZOLAM 0.25 MG PO TABS: 0.2500 mg | ORAL_TABLET | Freq: Three times a day (TID) | ORAL | 0 refills | Status: DC | PRN

## 2020-09-05 NOTE — Progress Notes (Signed)
Established patient visit   Patient: Allison Moss   DOB: Jan 23, 1981   40 y.o. Female  MRN: [id number] Visit Date: 09/05/2020  Today's healthcare provider: Jairo Ben, FNP   Chief Complaint  Patient presents with  . Anxiety   Subjective    Anxiety Presents for initial visit. Onset was 1 to 4 weeks ago (patient states that her and spouse recently seperated, patient reports that son has been in depressive stateas well and been suicidal ). Symptoms include decreased concentration, depressed mood, excessive worry, insomnia, malaise, nervous/anxious behavior, panic and restlessness. Patient reports no chest pain, compulsions, confusion, dizziness, dry mouth, feeling of choking, hyperventilation, impotence, irritability, muscle tension, nausea, obsessions, palpitations, shortness of breath or suicidal ideas. The symptoms are aggravated by family issues. The quality of sleep is poor.     She had been with her husband for 18 years and he was cheating on her. She and her son left the home finally.Her son is 98 years old and having a hard time with it all.  She has missed days at work due to this. 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  She denied any suicidal or homicidal ideations. She cries often due to stress on her son.  Patient  denies any fever, body aches,chills, rash, chest pain, shortness of breath, nausea, vomiting, or diarrhea.  Denies dizziness, lightheadedness, pre syncopal or syncopal episodes.       Medications: Outpatient Medications Prior to Visit  Medication Sig  . albuterol (VENTOLIN HFA) 108 (90 Base) MCG/ACT inhaler Inhale 1 puff into the lungs every 6 (six) hours as needed for wheezing or shortness of breath.   No facility-administered medications prior to visit.    Review of Systems  Constitutional: Negative for irritability.  Respiratory: Negative for shortness of breath.   Cardiovascular: Negative for chest pain and  palpitations.  Gastrointestinal: Negative for nausea.  Genitourinary: Negative for impotence.  Neurological: Negative for dizziness.  Psychiatric/Behavioral: Positive for decreased concentration. Negative for confusion and suicidal ideas. The patient is nervous/anxious and has insomnia.     Last CBC Lab Results  Component Value Date   WBC 10.2 06/10/2020   HGB 12.2 06/10/2020   HCT 36.0 06/10/2020   MCV 95.7 06/10/2020   MCH 32.4 06/10/2020   RDW 14.3 06/10/2020   PLT 294 06/10/2020   Last metabolic panel Lab Results  Component Value Date   GLUCOSE 138 (H) 06/10/2020   NA 138 06/10/2020   K 3.6 06/10/2020   CL 106 06/10/2020   CO2 23 06/10/2020   BUN 11 06/10/2020   CREATININE 0.82 06/10/2020   GFRNONAA >60 06/10/2020   GFRAA 125 11/23/2017   CALCIUM 9.2 06/10/2020   PHOS 2.5 11/23/2017   PROT 7.0 06/10/2020   ALBUMIN 4.2 06/10/2020   BILITOT 0.6 06/10/2020   ALKPHOS 73 06/10/2020   AST 15 06/10/2020   ALT 15 06/10/2020   ANIONGAP 9 06/10/2020   Last thyroid functions Lab Results  Component Value Date   TSH 3.926 11/05/2016       Objective    BP (!) 150/99   Pulse 80   Resp 16   Wt 231 lb 6.4 oz (105 kg)   SpO2 100%   BMI 34.17 kg/m  BP Readings from Last 3 Encounters:  09/05/20 (!) 150/99  06/11/20 122/75  06/10/20 (!) 134/92   Wt Readings from Last 3 Encounters:  09/05/20 231 lb 6.4 oz (105 kg)  07/19/20 237 lb (107.5 kg)  06/11/20 249 lb (112.9 kg)       Physical Exam Vitals reviewed.  Constitutional:      General: She is not in acute distress.    Appearance: She is well-developed. She is not diaphoretic.     Interventions: She is not intubated. HENT:     Head: Normocephalic and atraumatic.     Right Ear: External ear normal.     Left Ear: External ear normal.     Nose: Nose normal. No congestion.     Mouth/Throat:     Mouth: Mucous membranes are moist.     Pharynx: No oropharyngeal exudate.  Eyes:     General: Lids are normal. No  scleral icterus.       Right eye: No discharge.        Left eye: No discharge.     Conjunctiva/sclera: Conjunctivae normal.     Right eye: Right conjunctiva is not injected. No exudate or hemorrhage.    Left eye: Left conjunctiva is not injected. No exudate or hemorrhage.    Pupils: Pupils are equal, round, and reactive to light.  Neck:     Thyroid: No thyroid mass or thyromegaly.     Vascular: Normal carotid pulses. No carotid bruit, hepatojugular reflux or JVD.     Trachea: Trachea and phonation normal. No tracheal tenderness or tracheal deviation.     Meningeal: Brudzinski's sign and Kernig's sign absent.  Cardiovascular:     Rate and Rhythm: Normal rate and regular rhythm.     Pulses: Normal pulses.          Radial pulses are 2+ on the right side and 2+ on the left side.       Dorsalis pedis pulses are 2+ on the right side and 2+ on the left side.       Posterior tibial pulses are 2+ on the right side and 2+ on the left side.     Heart sounds: Normal heart sounds, S1 normal and S2 normal. Heart sounds not distant. No murmur heard. No friction rub. No gallop.   Pulmonary:     Effort: Pulmonary effort is normal. No tachypnea, bradypnea, accessory muscle usage or respiratory distress. She is not intubated.     Breath sounds: Normal breath sounds. No stridor. No wheezing or rales.  Chest:     Chest wall: No tenderness.  Breasts:     Right: No supraclavicular adenopathy.     Left: No supraclavicular adenopathy.    Abdominal:     General: Bowel sounds are normal. There is no distension or abdominal bruit.     Palpations: Abdomen is soft. There is no shifting dullness, fluid wave, hepatomegaly, splenomegaly, mass or pulsatile mass.     Tenderness: There is no abdominal tenderness. There is no guarding or rebound.     Hernia: No hernia is present.  Musculoskeletal:        General: No tenderness or deformity. Normal range of motion.     Cervical back: Full passive range of motion  without pain, normal range of motion and neck supple. No edema, erythema or rigidity. No spinous process tenderness or muscular tenderness. Normal range of motion.  Lymphadenopathy:     Head:     Right side of head: No submental, submandibular, tonsillar, preauricular, posterior auricular or occipital adenopathy.     Left side of head: No submental, submandibular, tonsillar, preauricular, posterior auricular or occipital adenopathy.     Cervical: No cervical  adenopathy.     Right cervical: No superficial, deep or posterior cervical adenopathy.    Left cervical: No superficial, deep or posterior cervical adenopathy.     Upper Body:     Right upper body: No supraclavicular or pectoral adenopathy.     Left upper body: No supraclavicular or pectoral adenopathy.  Skin:    General: Skin is warm and dry.     Coloration: Skin is not pale.     Findings: No abrasion, bruising, burn, ecchymosis, erythema, lesion, petechiae or rash.     Nails: There is no clubbing.  Neurological:     Mental Status: She is alert and oriented to person, place, and time.     GCS: GCS eye subscore is 4. GCS verbal subscore is 5. GCS motor subscore is 6.     Cranial Nerves: Cranial nerves are intact. No cranial nerve deficit.     Sensory: Sensation is intact. No sensory deficit.     Motor: Motor function is intact. No tremor, atrophy, abnormal muscle tone or seizure activity.     Coordination: Coordination is intact. Coordination normal.     Gait: Gait normal.     Deep Tendon Reflexes: Reflexes are normal and symmetric. Reflexes normal. Babinski sign absent on the right side. Babinski sign absent on the left side.     Reflex Scores:      Tricep reflexes are 2+ on the right side and 2+ on the left side.      Bicep reflexes are 2+ on the right side and 2+ on the left side.      Brachioradialis reflexes are 2+ on the right side and 2+ on the left side.      Patellar reflexes are 2+ on the right side and 2+ on the left  side.      Achilles reflexes are 2+ on the right side and 2+ on the left side. Psychiatric:        Attention and Perception: Attention normal.        Mood and Affect: Affect is tearful.        Speech: Speech normal.        Behavior: Behavior normal. Behavior is cooperative.        Thought Content: Thought content normal. Thought content does not include suicidal ideation. Thought content does not include suicidal plan.        Cognition and Memory: Cognition and memory normal.        Judgment: Judgment normal. Judgment is not impulsive or inappropriate.     Comments: Very tearful speaking about her husband and son.       No results found for any visits on 09/05/20.  Assessment & Plan     Anxiety - Plan: CBC with Differential/Platelet, TSH, Comprehensive Metabolic Panel (CMET)  Grief- associated with infidelity by her spouse.   Stress at home  Tearfulness She would like to try Zoloft , she has had facial rash in past with Lexapro. She will keep benadryl handy and stop medication Zoloft should nay unwanted side effects. She is doing EAP at work. Declined psychiatry referral at this time would prefer EAP since she is able to do this  For free. Denies any aphylaxis in past  reaction in pas or r ever. She understands xanax low dose is for short term use and hopefully to be stopped or decreased with full effectiveness of antidepressant.   Meds ordered this encounter  Medications  . sertraline (ZOLOFT) 50 MG tablet  Sig: Take 1 tablet (50 mg total) by mouth daily.    Dispense:  30 tablet    Refill:  1  . ALPRAZolam (XANAX) 0.25 MG tablet    Sig: Take 1 tablet (0.25 mg total) by mouth 3 (three) times daily as needed for anxiety.    Dispense:  30 tablet    Refill:  0    Discussed known black box warning for anti depression/ anxiety medication. Need to report any behavioral changes right, if any homicidal or suicidal thoughts or ideas seek medical attention right away. Call 911.   Red  Flags discussed. The patient was given clear instructions to go to ER or return to medical center if any red flags develop, symptoms do not improve, worsen or new problems develop. They verbalized understanding.  Return in about 2 weeks (around 09/19/2020) for at any time for any worsening symptoms, Go to Emergency room/ urgent care if worse.     The entirety of the information documented in the History of Present Illness, Review of Systems and Physical Exam were personally obtained by me. Portions of this information were initially documented by the CMA and reviewed by me for thoroughness and accuracy.      Jairo Ben, FNP  [facility] [phone] (phone) [phone] (fax)  [facility]

## 2020-09-05 NOTE — Patient Instructions (Signed)
Keep liquid benadryl on hand and stop if any hives or allergy symptoms immediately and let the office know.    Sertraline Tablets What is this medicine? SERTRALINE (SER tra leen) is used to treat depression. It may also be used to treat obsessive compulsive disorder, panic disorder, post-trauma stress disorder, premenstrual dysphoric disorder (PMDD) or social anxiety. This medicine may be used for other purposes; ask your health care provider or pharmacist if you have questions. COMMON BRAND NAME(S): Zoloft What should I tell my health care provider before I take this medicine? They need to know if you have any of these conditions:  bleeding disorders  bipolar disorder or a family history of bipolar disorder  glaucoma  heart disease  high blood pressure  history of irregular heartbeat  history of low levels of calcium, magnesium, or potassium in the blood  if you often drink alcohol  liver disease  receiving electroconvulsive therapy  seizures  suicidal thoughts, plans, or attempt; a previous suicide attempt by you or a family member  take medicines that treat or prevent blood clots  thyroid disease  an unusual or allergic reaction to sertraline, other medicines, foods, dyes, or preservatives  pregnant or trying to get pregnant  breast-feeding How should I use this medicine? Take this medicine by mouth with a glass of water. Follow the directions on the prescription label. You can take it with or without food. Take your medicine at regular intervals. Do not take your medicine more often than directed. Do not stop taking this medicine suddenly except upon the advice of your doctor. Stopping this medicine too quickly may cause serious side effects or your condition may worsen. A special MedGuide will be given to you by the pharmacist with each prescription and refill. Be sure to read this information carefully each time. Talk to your pediatrician regarding the use of  this medicine in children. While this drug may be prescribed for children as young as 7 years for selected conditions, precautions do apply. Overdosage: If you think you have taken too much of this medicine contact a poison control center or emergency room at once. NOTE: This medicine is only for you. Do not share this medicine with others. What if I miss a dose? If you miss a dose, take it as soon as you can. If it is almost time for your next dose, take only that dose. Do not take double or extra doses. What may interact with this medicine? Do not take this medicine with any of the following medications:  cisapride  dronedarone  linezolid  MAOIs like Carbex, Eldepryl, Marplan, Nardil, and Parnate  methylene blue (injected into a vein)  pimozide  thioridazine This medicine may also interact with the following medications:  alcohol  amphetamines  aspirin and aspirin-like medicines  certain medicines for depression, anxiety, or psychotic disturbances  certain medicines for fungal infections like ketoconazole, fluconazole, posaconazole, and itraconazole  certain medicines for irregular heart beat like flecainide, quinidine, propafenone  certain medicines for migraine headaches like almotriptan, eletriptan, frovatriptan, naratriptan, rizatriptan, sumatriptan, zolmitriptan  certain medicines for sleep  certain medicines for seizures like carbamazepine, valproic acid, phenytoin  certain medicines that treat or prevent blood clots like warfarin, enoxaparin, dalteparin  cimetidine  digoxin  diuretics  fentanyl  isoniazid  lithium  NSAIDs, medicines for pain and inflammation, like ibuprofen or naproxen  other medicines that prolong the QT interval (cause an abnormal heart rhythm) like dofetilide  rasagiline  safinamide  supplements like [employer]. John's  wort, kava kava, valerian  tolbutamide  tramadol  tryptophan This list may not describe all possible  interactions. Give your health care provider a list of all the medicines, herbs, non-prescription drugs, or dietary supplements you use. Also tell them if you smoke, drink alcohol, or use illegal drugs. Some items may interact with your medicine. What should I watch for while using this medicine? Tell your doctor if your symptoms do not get better or if they get worse. Visit your doctor or health care professional for regular checks on your progress. Because it may take several weeks to see the full effects of this medicine, it is important to continue your treatment as prescribed by your doctor. Patients and their families should watch out for new or worsening thoughts of suicide or depression. Also watch out for sudden changes in feelings such as feeling anxious, agitated, panicky, irritable, hostile, aggressive, impulsive, severely restless, overly excited and hyperactive, or not being able to sleep. If this happens, especially at the beginning of treatment or after a change in dose, call your health care professional. Bonita Quin may get drowsy or dizzy. Do not drive, use machinery, or do anything that needs mental alertness until you know how this medicine affects you. Do not stand or sit up quickly, especially if you are an older patient. This reduces the risk of dizzy or fainting spells. Alcohol may interfere with the effect of this medicine. Avoid alcoholic drinks. Your mouth may get dry. Chewing sugarless gum or sucking hard candy, and drinking plenty of water may help. Contact your doctor if the problem does not go away or is severe. What side effects may I notice from receiving this medicine? Side effects that you should report to your doctor or health care professional as soon as possible:  allergic reactions like skin rash, itching or hives, swelling of the face, lips, or tongue  anxious  black, tarry stools  changes in vision  confusion  elevated mood, decreased need for sleep, racing  thoughts, impulsive behavior  eye pain  fast, irregular heartbeat  feeling faint or lightheaded, falls  feeling agitated, angry, or irritable  hallucination, loss of contact with reality  loss of balance or coordination  loss of memory  painful or prolonged erections  restlessness, pacing, inability to keep still  seizures  stiff muscles  suicidal thoughts or other mood changes  trouble sleeping  unusual bleeding or bruising  unusually weak or tired  vomiting Side effects that usually do not require medical attention (report to your doctor or health care professional if they continue or are bothersome):  change in appetite or weight  change in sex drive or performance  diarrhea  increased sweating  indigestion, nausea  tremors This list may not describe all possible side effects. Call your doctor for medical advice about side effects. You may report side effects to FDA at 1-800-FDA-1088. Where should I keep my medicine? Keep out of the reach of children. Store at room temperature between 15 and 30 degrees C (59 and 86 degrees F). Throw away any unused medicine after the expiration date. NOTE: This sheet is a summary. It may not cover all possible information. If you have questions about this medicine, talk to your doctor, pharmacist, or health care provider.  2021 Elsevier/Gold Standard (2020-05-02 09:18:23) Alprazolam tablets What is this medicine? ALPRAZOLAM (al PRAY zoe lam) is a benzodiazepine. It is used to treat anxiety and panic attacks. This medicine may be used for other purposes; ask your health  care provider or pharmacist if you have questions. COMMON BRAND NAME(S): Xanax What should I tell my health care provider before I take this medicine? They need to know if you have any of these conditions:  depression or other mental health disease  history of alcohol or medicine abuse or addiction  kidney disease  liver disease  lung disease,  asthma, or breathing problem  seizures  suicidal thoughts, plans or attempt  an unusual or allergic reaction to alprazolam, other benzodiazepines, foods, dyes, or preservatives  pregnant or trying to get pregnant  breast-feeding How should I use this medicine? Take this medicine by mouth. Take it as directed on the prescription label. Do not take it more often than directed. Keep taking it unless your health care provider tells you to stop. A special MedGuide will be given to you by the pharmacist with each prescription and refill. Be sure to read this information carefully each time. Talk to your health care provider about the use of this medicine in children. Special care may be needed. Patients over 89 years of age may have a stronger reaction and need a smaller dose. Overdosage: If you think you have taken too much of this medicine contact a poison control center or emergency room at once. NOTE: This medicine is only for you. Do not share this medicine with others. What if I miss a dose? If you miss a dose, take it as soon as you can. If it is almost time for your next dose, take only that dose. Do not take double or extra doses. What may interact with this medicine? Do not take this medicine with any of the following medications:  certain antivirals for HIV or hepatitis  certain medicines for fungal infections like ketoconazole, itraconazole, or posaconazole  clarithromycin  grapefruit juice  narcotic medicines for cough  sodium oxybate This medicine may also interact with the following medications:  alcohol  antihistamines for allergy, cough and cold  certain medicines for anxiety or sleep  certain medicines for depression like amitriptyline, fluoxetine, fluvoxamine, nefazodone, sertraline  certain medicines for seizures like carbamazepine, phenobarbital, phenytoin, primidone  cimetidine  digoxin  erythromycin  female hormones, like estrogens or progestins  and birth control pills, patches, rings, or injections  general anesthetics like halothane, isoflurane, methoxyflurane, propofol  medicines that relax muscles  narcotic medicines for pain  phenothiazines like chlorpromazine, mesoridazine, prochlorperazine, thioridazine This list may not describe all possible interactions. Give your health care provider a list of all the medicines, herbs, non-prescription drugs, or dietary supplements you use. Also tell them if you smoke, drink alcohol, or use illegal drugs. Some items may interact with your medicine. What should I watch for while using this medicine? Visit your health care provider for regular checks on your progress. Tell your health care provider if your symptoms do not start to get better or if they get worse. Do not stop taking except on your doctor's advice. You may develop a severe reaction. Your doctor will tell you how much medicine to take. You may get drowsy or dizzy. Do not drive, use machinery, or do anything that needs mental alertness until you know how this medicine affects you. To reduce the risk of dizzy and fainting spells, do not stand or sit up quickly, especially if you are an older patient. Alcohol may increase dizziness and drowsiness. Avoid alcoholic drinks. If you are taking another medicine that also causes drowsiness, you may have more side effects. Give your health care provider  a list of all medicines you use. Your doctor will tell you how much medicine to take. Do not take more medicine than directed. Call emergency for help if you have problems breathing or unusual sleepiness. Women should inform their health care provider if they wish to become pregnant or think they might be pregnant. Do not breast-feed while taking this medicine. Talk to your health care provider for more information. What side effects may I notice from receiving this medicine? Side effects that you should report to your doctor or health care  professional as soon as possible:  allergic reactions like skin rash, itching or hives, swelling of the face, lips, or tongue  confusion  loss of balance or coordination  signs and symptoms of low blood pressure like dizziness; feeling faint or lightheaded, falls; unusually weak or tired  suicidal thoughts or other mood changes  trouble breathing  trouble saying things clearly Side effects that usually do not require medical attention (report to your doctor or health care professional if they continue or are bothersome):  changes in sex drive  drowsiness  dry mouth  tiredness This list may not describe all possible side effects. Call your doctor for medical advice about side effects. You may report side effects to FDA at 1-800-FDA-1088. Where should I keep my medicine? Keep out of the reach of children and pets. This medicine can be abused. Keep it in a safe place to protect it from theft. Do not share it with anyone. It is only for you. Selling or giving away this medicine is dangerous and against the law. Store at room temperature between 20 and 25 degrees C (68 and 77 degrees F). Get rid of any unused medicine after the expiration date. This medicine may cause harm and death if it is taken by other adults, children, or pets. It is important to get rid of the medicine as soon as you no longer need it or it is expired. You can do this in two ways:  Take the medicine to a medicine take-back program. Check with your pharmacy or law enforcement to find a location.  If you cannot return the medicine, check the label or package insert to see if the medicine should be thrown out in the garbage or flushed down the toilet. If you are not sure, ask your health care provider. If it is safe to put it in the trash, take the medicine out of the container. Mix the medicine with cat litter, dirt, coffee grounds, or other unwanted substance. Seal the mixture in a bag or container. Put it in the  trash. NOTE: This sheet is a summary. It may not cover all possible information. If you have questions about this medicine, talk to your doctor, pharmacist, or health care provider.  2021 Elsevier/Gold Standard (2019-12-25 15:56:13)

## 2020-09-06 LAB — CBC WITH DIFFERENTIAL/PLATELET
Basophils Absolute: 0.1 10*3/uL (ref 0.0–0.2)
Basos: 1 %
EOS (ABSOLUTE): 0.1 10*3/uL (ref 0.0–0.4)
Eos: 1 %
Hematocrit: 38.5 % (ref 34.0–46.6)
Hemoglobin: 13.3 g/dL (ref 11.1–15.9)
Immature Grans (Abs): 0 10*3/uL (ref 0.0–0.1)
Immature Granulocytes: 0 %
Lymphocytes Absolute: 1.9 10*3/uL (ref 0.7–3.1)
Lymphs: 23 %
MCH: 32.2 pg (ref 26.6–33.0)
MCHC: 34.5 g/dL (ref 31.5–35.7)
MCV: 93 fL (ref 79–97)
Monocytes Absolute: 0.5 10*3/uL (ref 0.1–0.9)
Monocytes: 5 %
Neutrophils Absolute: 5.8 10*3/uL (ref 1.4–7.0)
Neutrophils: 70 %
Platelets: 326 10*3/uL (ref 150–450)
RBC: 4.13 x10E6/uL (ref 3.77–5.28)
RDW: 13.6 % (ref 11.7–15.4)
WBC: 8.3 10*3/uL (ref 3.4–10.8)

## 2020-09-06 LAB — COMPREHENSIVE METABOLIC PANEL
ALT: 11 IU/L (ref 0–32)
AST: 9 IU/L (ref 0–40)
Albumin/Globulin Ratio: 2 (ref 1.2–2.2)
Albumin: 4.7 g/dL (ref 3.8–4.8)
Alkaline Phosphatase: 98 IU/L (ref 44–121)
BUN/Creatinine Ratio: 19 (ref 9–23)
BUN: 14 mg/dL (ref 6–20)
Bilirubin Total: 0.8 mg/dL (ref 0.0–1.2)
CO2: 20 mmol/L (ref 20–29)
Calcium: 9.9 mg/dL (ref 8.7–10.2)
Chloride: 104 mmol/L (ref 96–106)
Creatinine, Ser: 0.73 mg/dL (ref 0.57–1.00)
Globulin, Total: 2.4 g/dL (ref 1.5–4.5)
Glucose: 87 mg/dL (ref 65–99)
Potassium: 4.7 mmol/L (ref 3.5–5.2)
Sodium: 139 mmol/L (ref 134–144)
Total Protein: 7.1 g/dL (ref 6.0–8.5)
eGFR: 108 mL/min/{1.73_m2} (ref >59)

## 2020-09-06 LAB — TSH: TSH: 3.89 u[IU]/mL (ref 0.450–4.500)

## 2020-09-06 NOTE — Progress Notes (Signed)
  CBC, CMP, TSH within normal limits. Written by Berniece Pap, FNP on 09/06/2020 10:39 AM EST

## 2020-09-06 NOTE — Progress Notes (Signed)
CBC, CMP, TSH within normal limits

## 2020-09-11 ENCOUNTER — Telehealth: Payer: Self-pay

## 2020-09-11 NOTE — Telephone Encounter (Signed)
Spoke with patient on the phone and advised her that form was faxed and original copy is available for pick up, up front. Patient was advised  You will review request for Alprazolam when you return in the morning. She states that Alprazolam is helping but she feels like she needs dose to be increased because of her feeling anxious still and reports of feeling like her heart is racing when having these anxious episodes. Patient states that on Sunday she was at a cookout and was sitting when she noticed her heart seem to flutter, patient states that her watch had went off alarming her that her heart rate was 192. Patient denied chest pain, arm pain, back pain, dyspnea, near syncope, edema, paresthesias, headache or speech difficulty. Patient states that she only had the one incident on Sunday when heart rate was over 100 she states since then heart rate has been ranging from 80-90s. Please advise. KW

## 2020-09-11 NOTE — Telephone Encounter (Signed)
Copied from CRM [id number]. Topic: General - Other >> Sep 11, 2020 10:09 AM Jaquita Rector A wrote: Reason for CRM: Patient called in to inquire of Marvell Fuller about the form that was sent for completion and also asking her to increase the ALPRAZolam (XANAX) 0.25 MG tablet. Also say that she noticed that her heart rate is really high on 09/08/20 say that HR was 192 and asking what to do. Please call  Ph# [phone]

## 2020-09-13 ENCOUNTER — Other Ambulatory Visit: Payer: Self-pay

## 2020-09-13 ENCOUNTER — Ambulatory Visit (INDEPENDENT_AMBULATORY_CARE_PROVIDER_SITE_OTHER): Payer: No Typology Code available for payment source | Admitting: Adult Health

## 2020-09-13 ENCOUNTER — Encounter: Payer: Self-pay | Admitting: Adult Health

## 2020-09-13 ENCOUNTER — Other Ambulatory Visit: Payer: Self-pay | Admitting: Adult Health

## 2020-09-13 VITALS — BP 122/84 | HR 94 | Resp 16 | Wt 229.4 lb

## 2020-09-13 DIAGNOSIS — F322 Major depressive disorder, single episode, severe without psychotic features: Secondary | ICD-10-CM

## 2020-09-13 DIAGNOSIS — R4589 Other symptoms and signs involving emotional state: Secondary | ICD-10-CM | POA: Diagnosis not present

## 2020-09-13 DIAGNOSIS — F419 Anxiety disorder, unspecified: Secondary | ICD-10-CM | POA: Diagnosis not present

## 2020-09-13 DIAGNOSIS — F439 Reaction to severe stress, unspecified: Secondary | ICD-10-CM

## 2020-09-13 DIAGNOSIS — F4321 Adjustment disorder with depressed mood: Secondary | ICD-10-CM

## 2020-09-13 MED ORDER — ALPRAZOLAM 0.5 MG PO TABS: 0.5000 mg | ORAL_TABLET | Freq: Three times a day (TID) | ORAL | 0 refills | Status: DC | PRN

## 2020-09-13 NOTE — Telephone Encounter (Signed)
Meds ordered this encounter  Medications   ALPRAZolam (XANAX) 0.5 MG tablet    Sig: Take 1-2 tablets (0.5-1 mg total) by mouth 3 (three) times daily as needed for anxiety (will cause drowsiness.).    Dispense:  90 tablet    Refill:  0  Increased from 0.25 mg to 0.5 mg she should start out with the 0.5 mg as directed and can increased to 1mg  if needed. This is to be short term while the antidepressant starts to work with the expectation of lowering the xanax back down or discontinuing in the future. Please schedule office visit if needed or symptoms worsening at anytime.  Avoid excessive caffiene and if this occurs again with her heart rate I would want to see her, if over weekend advise ER.

## 2020-09-13 NOTE — Progress Notes (Signed)
Meds ordered this encounter  Medications  . ALPRAZolam (XANAX) 0.5 MG tablet    Sig: Take 1-2 tablets (0.5-1 mg total) by mouth 3 (three) times daily as needed for anxiety (will cause drowsiness.).    Dispense:  90 tablet    Refill:  0

## 2020-09-13 NOTE — Patient Instructions (Signed)
Sertraline Solution What is this medicine? SERTRALINE (SER tra leen) is used to treat depression. It may also be used to treat obsessive compulsive disorder, panic disorder, post-trauma stress disorder, premenstrual dysphoric disorder (PMDD) or social anxiety. This medicine may be used for other purposes; ask your health care provider or pharmacist if you have questions. COMMON BRAND NAME(S): Zoloft What should I tell my health care provider before I take this medicine? They need to know if you have any of these conditions:  bleeding disorders  bipolar disorder or a family history of bipolar disorder  glaucoma  heart disease  high blood pressure  history of irregular heartbeat  history of low levels of calcium, magnesium, or potassium in the blood  if you often drink alcohol  liver disease  receiving electroconvulsive therapy  seizures  suicidal thoughts, plans, or attempt; a previous suicide attempt by you or a family member  take medicines that treat or prevent blood clots  thyroid disease  an unusual or allergic reaction to sertraline, other medicines, foods, dyes, or preservatives  pregnant or trying to get pregnant  breast-feeding How should I use this medicine? Take this medicine by mouth. Follow the directions on the prescription label. Before taking your dose, you need to dilute the solution in a beverage. Measure your medicine dose using the dropper in the bottle. Next, place the measured dose in at least 4 ounces (one-half cup) of water, ginger-ale, lemon-lime soda, lemonade or orange juice and mix. Do not mix in grapefruit juice or in any other liquids other than those listed. Drink all of mixed liquid immediately. Do not mix the dose and store it for later. It must be taken right away. You may take this medicine with or without food. Take your medicine at regular intervals. Do not take your medicine more often than directed. Do not stop taking this medicine  suddenly except upon the advice of your doctor. Stopping this medicine too quickly may cause serious side effects or your condition may worsen. A special MedGuide will be given to you by the pharmacist with each prescription and refill. Be sure to read this information carefully each time. Talk to your pediatrician regarding the use of this medicine in children. While this drug may be prescribed for children as young as 7 years for selected conditions, precautions do apply. Overdosage: If you think you have taken too much of this medicine contact a poison control center or emergency room at once. NOTE: This medicine is only for you. Do not share this medicine with others. What if I miss a dose? If you miss a dose, take it as soon as you can. If it is almost time for your next dose, take only that dose. Do not take double or extra doses. What may interact with this medicine? Do not take this medicine with any of the following medications:  cisapride  dronedarone  linezolid  MAOIs like Carbex, Eldepryl, Marplan, Nardil, and Parnate  methylene blue (injected into a vein)  pimozide  thioridazine This medicine may also interact with the following medications:  alcohol  amphetamines  aspirin and aspirin-like medicines  certain medicines for depression, anxiety, or psychotic disturbances  certain medicines for fungal infections like ketoconazole, fluconazole, posaconazole, and itraconazole  certain medicines for irregular heart beat like flecainide, quinidine, propafenone  certain medicines for migraine headaches like almotriptan, eletriptan, frovatriptan, naratriptan, rizatriptan, sumatriptan, zolmitriptan  certain medicines for sleep  certain medicines for seizures like carbamazepine, valproic acid, phenytoin  certain medicines  that treat or prevent blood clots like warfarin, enoxaparin,  dalteparin  cimetidine  digoxin  diuretics  fentanyl  isoniazid  lithium  NSAIDs, medicines for pain and inflammation, like ibuprofen or naproxen  other medicines that prolong the QT interval (cause an abnormal heart rhythm) like dofetilide  rasagiline  safinamide  supplements like St. John's wort, kava kava, valerian  tolbutamide  tramadol  tryptophan This list may not describe all possible interactions. Give your health care provider a list of all the medicines, herbs, non-prescription drugs, or dietary supplements you use. Also tell them if you smoke, drink alcohol, or use illegal drugs. Some items may interact with your medicine. What should I watch for while using this medicine? Tell your doctor if your symptoms do not get better or if they get worse. Visit your doctor or health care professional for regular checks on your progress. Because it may take several weeks to see the full effects of this medicine, it is important to continue your treatment as prescribed by your doctor. Patients and their families should watch out for new or worsening thoughts of suicide or depression. Also watch out for sudden changes in feelings such as feeling anxious, agitated, panicky, irritable, hostile, aggressive, impulsive, severely restless, overly excited and hyperactive, or not being able to sleep. If this happens, especially at the beginning of treatment or after a change in dose, call your health care professional. You may get drowsy or dizzy. Do not drive, use machinery, or do anything that needs mental alertness until you know how this medicine affects you. Do not stand or sit up quickly, especially if you are an older patient. This reduces the risk of dizzy or fainting spells. Alcohol may interfere with the effect of this medicine. Avoid alcoholic drinks. This medicine contains a high percentage of alcohol that may interact with medicines used to treat alcohol abuse, like Antabuse  (disulfiram). You should not take these medicines together. Your mouth may get dry. Chewing sugarless gum or sucking hard candy, and drinking plenty of water may help. Contact your doctor if the problem does not go away or is severe. Some products may contain alcohol. Ask your pharmacist or healthcare provider if this medicine contains alcohol. Be sure to tell all healthcare providers you are taking this medicine. Certain medicines, like metronidazole and disulfiram, can cause an unpleasant reaction when taken with alcohol. The reaction includes flushing, headache, nausea, vomiting, sweating, and increased thirst. The reaction can last from 30 minutes to several hours. What side effects may I notice from receiving this medicine? Side effects that you should report to your doctor or health care professional as soon as possible:  allergic reactions like skin rash, itching or hives, swelling of the face, lips, or tongue  anxious  black, tarry stools  changes in vision  confusion  elevated mood, decreased need for sleep, racing thoughts, impulsive behavior  eye pain  fast, irregular heartbeat  feeling faint or lightheaded, falls  feeling agitated, angry, or irritable  hallucination, loss of contact with reality  loss of balance or coordination  loss of memory  painful or prolonged erections  restlessness, pacing, inability to keep still  seizures  stiff muscles  suicidal thoughts or other mood changes  trouble sleeping  unusual bleeding or bruising  unusually weak or tired  vomiting Side effects that usually do not require medical attention (report to your doctor or health care professional if they continue or are bothersome):  change in appetite or weight    change in sex drive or performance  diarrhea  increased sweating  indigestion, nausea  tremors This list may not describe all possible side effects. Call your doctor for medical advice about side effects.  You may report side effects to FDA at 1-800-FDA-1088. Where should I keep my medicine? Keep out of the reach of children. Store at room temperature between 15 and 30 degrees C (59 and 86 degrees F). Throw away any unused medicine after the expiration date. NOTE: This sheet is a summary. It may not cover all possible information. If you have questions about this medicine, talk to your doctor, pharmacist, or health care provider.  2021 Elsevier/Gold Standard (2020-05-01 19:04:00) Alprazolam tablets What is this medicine? ALPRAZOLAM (al PRAY zoe lam) is a benzodiazepine. It is used to treat anxiety and panic attacks. This medicine may be used for other purposes; ask your health care provider or pharmacist if you have questions. COMMON BRAND NAME(S): Xanax What should I tell my health care provider before I take this medicine? They need to know if you have any of these conditions:  depression or other mental health disease  history of alcohol or medicine abuse or addiction  kidney disease  liver disease  lung disease, asthma, or breathing problem  seizures  suicidal thoughts, plans or attempt  an unusual or allergic reaction to alprazolam, other benzodiazepines, foods, dyes, or preservatives  pregnant or trying to get pregnant  breast-feeding How should I use this medicine? Take this medicine by mouth. Take it as directed on the prescription label. Do not take it more often than directed. Keep taking it unless your health care provider tells you to stop. A special MedGuide will be given to you by the pharmacist with each prescription and refill. Be sure to read this information carefully each time. Talk to your health care provider about the use of this medicine in children. Special care may be needed. Patients over 30 years of age may have a stronger reaction and need a smaller dose. Overdosage: If you think you have taken too much of this medicine contact a poison control center  or emergency room at once. NOTE: This medicine is only for you. Do not share this medicine with others. What if I miss a dose? If you miss a dose, take it as soon as you can. If it is almost time for your next dose, take only that dose. Do not take double or extra doses. What may interact with this medicine? Do not take this medicine with any of the following medications:  certain antivirals for HIV or hepatitis  certain medicines for fungal infections like ketoconazole, itraconazole, or posaconazole  clarithromycin  grapefruit juice  narcotic medicines for cough  sodium oxybate This medicine may also interact with the following medications:  alcohol  antihistamines for allergy, cough and cold  certain medicines for anxiety or sleep  certain medicines for depression like amitriptyline, fluoxetine, fluvoxamine, nefazodone, sertraline  certain medicines for seizures like carbamazepine, phenobarbital, phenytoin, primidone  cimetidine  digoxin  erythromycin  female hormones, like estrogens or progestins and birth control pills, patches, rings, or injections  general anesthetics like halothane, isoflurane, methoxyflurane, propofol  medicines that relax muscles  narcotic medicines for pain  phenothiazines like chlorpromazine, mesoridazine, prochlorperazine, thioridazine This list may not describe all possible interactions. Give your health care provider a list of all the medicines, herbs, non-prescription drugs, or dietary supplements you use. Also tell them if you smoke, drink alcohol, or use illegal drugs. Some  items may interact with your medicine. What should I watch for while using this medicine? Visit your health care provider for regular checks on your progress. Tell your health care provider if your symptoms do not start to get better or if they get worse. Do not stop taking except on your doctor's advice. You may develop a severe reaction. Your doctor will tell you  how much medicine to take. You may get drowsy or dizzy. Do not drive, use machinery, or do anything that needs mental alertness until you know how this medicine affects you. To reduce the risk of dizzy and fainting spells, do not stand or sit up quickly, especially if you are an older patient. Alcohol may increase dizziness and drowsiness. Avoid alcoholic drinks. If you are taking another medicine that also causes drowsiness, you may have more side effects. Give your health care provider a list of all medicines you use. Your doctor will tell you how much medicine to take. Do not take more medicine than directed. Call emergency for help if you have problems breathing or unusual sleepiness. Women should inform their health care provider if they wish to become pregnant or think they might be pregnant. Do not breast-feed while taking this medicine. Talk to your health care provider for more information. What side effects may I notice from receiving this medicine? Side effects that you should report to your doctor or health care professional as soon as possible:  allergic reactions like skin rash, itching or hives, swelling of the face, lips, or tongue  confusion  loss of balance or coordination  signs and symptoms of low blood pressure like dizziness; feeling faint or lightheaded, falls; unusually weak or tired  suicidal thoughts or other mood changes  trouble breathing  trouble saying things clearly Side effects that usually do not require medical attention (report to your doctor or health care professional if they continue or are bothersome):  changes in sex drive  drowsiness  dry mouth  tiredness This list may not describe all possible side effects. Call your doctor for medical advice about side effects. You may report side effects to FDA at 1-800-FDA-1088. Where should I keep my medicine? Keep out of the reach of children and pets. This medicine can be abused. Keep it in a safe place  to protect it from theft. Do not share it with anyone. It is only for you. Selling or giving away this medicine is dangerous and against the law. Store at room temperature between 20 and 25 degrees C (68 and 77 degrees F). Get rid of any unused medicine after the expiration date. This medicine may cause harm and death if it is taken by other adults, children, or pets. It is important to get rid of the medicine as soon as you no longer need it or it is expired. You can do this in two ways:  Take the medicine to a medicine take-back program. Check with your pharmacy or law enforcement to find a location.  If you cannot return the medicine, check the label or package insert to see if the medicine should be thrown out in the garbage or flushed down the toilet. If you are not sure, ask your health care provider. If it is safe to put it in the trash, take the medicine out of the container. Mix the medicine with cat litter, dirt, coffee grounds, or other unwanted substance. Seal the mixture in a bag or container. Put it in the trash. NOTE: This sheet is  a summary. It may not cover all possible information. If you have questions about this medicine, talk to your doctor, pharmacist, or health care provider.  2021 Elsevier/Gold Standard (2019-12-25 15:56:13) Managing Anxiety, Adult After being diagnosed with an anxiety disorder, you may be relieved to know why you have felt or behaved a certain way. You may also feel overwhelmed about the treatment ahead and what it will mean for your life. With care and support, you can manage this condition and recover from it. How to manage lifestyle changes Managing stress and anxiety Stress is your body's reaction to life changes and events, both good and bad. Most stress will last just a few hours, but stress can be ongoing and can lead to more than just stress. Although stress can play a major role in anxiety, it is not the same as anxiety. Stress is usually caused by  something external, such as a deadline, test, or competition. Stress normally passes after the triggering event has ended.  Anxiety is caused by something internal, such as imagining a terrible outcome or worrying that something will go wrong that will devastate you. Anxiety often does not go away even after the triggering event is over, and it can become long-term (chronic) worry. It is important to understand the differences between stress and anxiety and to manage your stress effectively so that it does not lead to an anxious response. Talk with your health care provider or a counselor to learn more about reducing anxiety and stress. He or she may suggest tension reduction techniques, such as:  Music therapy. This can include creating or listening to music that you enjoy and that inspires you.  Mindfulness-based meditation. This involves being aware of your normal breaths while not trying to control your breathing. It can be done while sitting or walking.  Centering prayer. This involves focusing on a word, phrase, or sacred image that means something to you and brings you peace.  Deep breathing. To do this, expand your stomach and inhale slowly through your nose. Hold your breath for 3-5 seconds. Then exhale slowly, letting your stomach muscles relax.  Self-talk. This involves identifying thought patterns that lead to anxiety reactions and changing those patterns.  Muscle relaxation. This involves tensing muscles and then relaxing them. Choose a tension reduction technique that suits your lifestyle and personality. These techniques take time and practice. Set aside 5-15 minutes a day to do them. Therapists can offer counseling and training in these techniques. The training to help with anxiety may be covered by some insurance plans. Other things you can do to manage stress and anxiety include:  Keeping a stress/anxiety diary. This can help you learn what triggers your reaction and then learn  ways to manage your response.  Thinking about how you react to certain situations. You may not be able to control everything, but you can control your response.  Making time for activities that help you relax and not feeling guilty about spending your time in this way.  Visual imagery and yoga can help you stay calm and relax.   Medicines Medicines can help ease symptoms. Medicines for anxiety include:  Anti-anxiety drugs.  Antidepressants. Medicines are often used as a primary treatment for anxiety disorder. Medicines will be prescribed by a health care provider. When used together, medicines, psychotherapy, and tension reduction techniques may be the most effective treatment. Relationships Relationships can play a big part in helping you recover. Try to spend more time connecting with trusted friends and  family members. Consider going to couples counseling, taking family education classes, or going to family therapy. Therapy can help you and others better understand your condition. How to recognize changes in your anxiety Everyone responds differently to treatment for anxiety. Recovery from anxiety happens when symptoms decrease and stop interfering with your daily activities at home or work. This may mean that you will start to:  Have better concentration and focus. Worry will interfere less in your daily thinking.  Sleep better.  Be less irritable.  Have more energy.  Have improved memory. It is important to recognize when your condition is getting worse. Contact your health care provider if your symptoms interfere with home or work and you feel like your condition is not improving. Follow these instructions at home: Activity  Exercise. Most adults should do the following: ? Exercise for at least 150 minutes each week. The exercise should increase your heart rate and make you sweat (moderate-intensity exercise). ? Strengthening exercises at least twice a week.  Get the right  amount and quality of sleep. Most adults need 7-9 hours of sleep each night. Lifestyle  Eat a healthy diet that includes plenty of vegetables, fruits, whole grains, low-fat dairy products, and lean protein. Do not eat a lot of foods that are high in solid fats, added sugars, or salt.  Make choices that simplify your life.  Do not use any products that contain nicotine or tobacco, such as cigarettes, e-cigarettes, and chewing tobacco. If you need help quitting, ask your health care provider.  Avoid caffeine, alcohol, and certain over-the-counter cold medicines. These may make you feel worse. Ask your pharmacist which medicines to avoid.   General instructions  Take over-the-counter and prescription medicines only as told by your health care provider.  Keep all follow-up visits as told by your health care provider. This is important. Where to find support You can get help and support from these sources:  Self-help groups.  Online and [employer].  A trusted spiritual leader.  Couples counseling.  Family education classes.  Family therapy. Where to find more information You may find that joining a support group helps you deal with your anxiety. The following sources can help you locate counselors or support groups near you:  Mental Health America: www.mentalhealthamerica.net  Anxiety and Depression Association of [place] (ADAA): ProgramCam.de  [employer]ness (NAMI): www.nami.org Contact a health care provider if you:  Have a hard time staying focused or finishing daily tasks.  Spend many hours a day feeling worried about everyday life.  Become exhausted by worry.  Start to have headaches, feel tense, or have nausea.  Urinate more than normal.  Have diarrhea. Get help right away if you have:  A racing heart and shortness of breath.  Thoughts of hurting yourself or others. If you ever feel like you may hurt yourself or others, or have  thoughts about taking your own life, get help right away. You can go to your nearest emergency department or call:  Your local emergency services (911 in the [place]  A suicide crisis helpline, such as the National Suicide Prevention Lifeline at (501)090-4763. This is open 24 hours a day. Summary  Taking steps to learn and use tension reduction techniques can help calm you and help prevent triggering an anxiety reaction.  When used together, medicines, psychotherapy, and tension reduction techniques may be the most effective treatment.  Family, friends, and partners can play a big part in helping you recover from an  anxiety disorder. This information is not intended to replace advice given to you by your health care provider. Make sure you discuss any questions you have with your health care provider. Document Revised: 11/22/2018 Document Reviewed: 11/22/2018 Elsevier Patient Education  [place].
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
## 2020-09-19 ENCOUNTER — Ambulatory Visit (INDEPENDENT_AMBULATORY_CARE_PROVIDER_SITE_OTHER): Payer: No Typology Code available for payment source | Admitting: Adult Health

## 2020-09-19 DIAGNOSIS — Z5329 Procedure and treatment not carried out because of patient's decision for other reasons: Secondary | ICD-10-CM

## 2020-09-23 DIAGNOSIS — Z91199 Patient's noncompliance with other medical treatment and regimen due to unspecified reason: Secondary | ICD-10-CM | POA: Insufficient documentation

## 2020-09-23 DIAGNOSIS — Z5329 Procedure and treatment not carried out because of patient's decision for other reasons: Secondary | ICD-10-CM | POA: Insufficient documentation

## 2020-09-23 NOTE — Progress Notes (Signed)
Did not show for appointment.

## 2020-09-26 ENCOUNTER — Ambulatory Visit (INDEPENDENT_AMBULATORY_CARE_PROVIDER_SITE_OTHER): Payer: No Typology Code available for payment source | Admitting: Adult Health

## 2020-09-26 ENCOUNTER — Other Ambulatory Visit: Payer: Self-pay

## 2020-09-26 ENCOUNTER — Encounter: Payer: Self-pay | Admitting: Adult Health

## 2020-09-26 VITALS — BP 122/92 | HR 81 | Resp 16 | Wt 229.9 lb

## 2020-09-26 DIAGNOSIS — F439 Reaction to severe stress, unspecified: Secondary | ICD-10-CM | POA: Diagnosis not present

## 2020-09-26 DIAGNOSIS — F419 Anxiety disorder, unspecified: Secondary | ICD-10-CM

## 2020-09-26 MED ORDER — SERTRALINE HCL 50 MG PO TABS: 75.0000 mg | ORAL_TABLET | Freq: Every day | ORAL | 0 refills | Status: DC

## 2020-09-26 NOTE — Progress Notes (Signed)
Established patient visit   Patient: Allison Moss   DOB: March 03, 1981   40 y.o. Female  MRN: [id number] Visit Date: 09/26/2020  Today's healthcare provider: Jairo Ben, FNP   Chief Complaint  Patient presents with  . Anxiety   Subjective    HPI  Anxiety, Follow-up  She was last seen for anxiety 2 weeks ago. Changes made at last visit include Xanax was increased from 0.25mg  tablets taking 1- 2 tablets every 8 hours to Xanax  0.5 mg taking 1-2 tablets every 8 hours as needed only.  Continue Zoloft 50 mg once every day since started just in the last few weeks.  .   She reports excellent compliance with treatment. She reports good tolerance of treatment. She is having side effects. Patient reports difficulty sleeping and reports that she wakes up every hour feeling anxious. Patient states that she would like to increase Zoloft to help with better symptom control. Patient states that she is taking Alprazolam 1mg  she states that this does help control symptoms of anxiety. Patient reports that heart rate has been improving from the 100s to now in the 80-90s She wakes up at night and she has racing thoughts of all the trauma she has been through recently.   Denies any suicidal or homicidal ideations.  Son is talking to pastor at church doing better. She is feeling much better.  Still on leave for work,   She feels her anxiety is moderate and Unchanged since last visit.  Symptoms: No chest pain Yes difficulty concentrating  No dizziness Yes fatigue  No feelings of losing control Yes insomnia  No irritable No palpitations  Yes panic attacks Yes racing thoughts  No shortness of breath No sweating  No tremors/shakes    GAD-7 Results GAD-7 Generalized Anxiety Disorder Screening Tool 09/05/2020  1. Feeling Nervous, Anxious, or on Edge 3  2. Not Being Able to Stop or Control Worrying 3  3. Worrying Too Much About Different Things 3  4. Trouble Relaxing 2  5. Being  So Restless it's Hard To Sit Still 3  6. Becoming Easily Annoyed or Irritable 3  7. Feeling Afraid As If Something Awful Might Happen 3  Total GAD-7 Score 20  Difficulty At Work, Home, or Getting  Along With Others? Very difficult    PHQ-9 Scores PHQ9 SCORE ONLY 06/11/2020 12/08/2018 11/09/2016  PHQ-9 Total Score 0 0 5    ---------------------------------------------------------------------------------------------------  Patient Active Problem List   Diagnosis Date Noted  . No-show for appointment 09/23/2020  . Depression, major, single episode, severe (HCC) 09/13/2020  . Stress at home 09/05/2020  . Grief- associated with infidelity by her spouse.  09/05/2020  . Anxiety 09/05/2020  . Tearfulness 09/05/2020  . Calculus of kidney 01/16/2015  . H/O anxiety state 01/09/2015  . History of asthma 01/09/2015  . History of migraine headaches 01/09/2015  . Psoriasis 01/09/2015  . 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 Arthritis    Rheumatoid and Psoriatic  . Asthma    Allergies  Allergen Reactions  . Acetaminophen-Codeine Itching  . Escitalopram Hives  . Escitalopram Oxalate   . Hydrocodone-Acetaminophen Nausea Only  . Hydrocodone-Acetaminophen   . Other   . Propoxyphene Hives  . Tramadol Hives       Medications: Outpatient Medications Prior to Visit  Medication Sig  . albuterol (VENTOLIN HFA) 108 (90 Base) MCG/ACT inhaler Inhale 1 puff into the lungs every 6 (  six) hours as needed for wheezing or shortness of breath.  . ALPRAZolam (XANAX) 0.5 MG tablet Take 1-2 tablets (0.5-1 mg total) by mouth 3 (three) times daily as needed for anxiety (will cause drowsiness.).  . [DISCONTINUED] sertraline (ZOLOFT) 50 MG tablet Take 1 tablet (50 mg total) by mouth daily.   No facility-administered medications prior to visit.    Review of Systems  Constitutional: Negative.   HENT: Negative.   Respiratory: Negative.   Cardiovascular:  Negative.   Gastrointestinal: Negative.   Genitourinary: Negative.   Musculoskeletal: Negative.   Skin: Negative.   Neurological: Negative.   Hematological: Negative.   Psychiatric/Behavioral: Negative.     Last CBC Lab Results  Component Value Date   WBC 8.3 09/05/2020   HGB 13.3 09/05/2020   HCT 38.5 09/05/2020   MCV 93 09/05/2020   MCH 32.2 09/05/2020   RDW 13.6 09/05/2020   PLT 326 09/05/2020   Last metabolic panel Lab Results  Component Value Date   GLUCOSE 87 09/05/2020   NA 139 09/05/2020   K 4.7 09/05/2020   CL 104 09/05/2020   CO2 20 09/05/2020   BUN 14 09/05/2020   CREATININE 0.73 09/05/2020   GFRNONAA >60 06/10/2020   GFRAA 125 11/23/2017   CALCIUM 9.9 09/05/2020   PHOS 2.5 11/23/2017   PROT 7.1 09/05/2020   ALBUMIN 4.7 09/05/2020   LABGLOB 2.4 09/05/2020   AGRATIO 2.0 09/05/2020   BILITOT 0.8 09/05/2020   ALKPHOS 98 09/05/2020   AST 9 09/05/2020   ALT 11 09/05/2020   ANIONGAP 9 06/10/2020   Last lipids No results found for: CHOL, HDL, LDLCALC, LDLDIRECT, TRIG, CHOLHDL Last hemoglobin A1c Lab Results  Component Value Date   HGBA1C 5.2 11/23/2017   Last thyroid functions Lab Results  Component Value Date   TSH 3.890 09/05/2020   Last vitamin D No results found for: 25OHVITD2, 25OHVITD3, VD25OH Last vitamin B12 and Folate No results found for: VITAMINB12, FOLATE     Objective    BP (!) 122/92   Pulse 81   Resp 16   Wt 229 lb 14.4 oz (104.3 kg)   SpO2 99%   BMI 33.95 kg/m  BP Readings from Last 3 Encounters:  09/26/20 (!) 122/92  09/13/20 122/84  09/05/20 (!) 150/99   Wt Readings from Last 3 Encounters:  09/26/20 229 lb 14.4 oz (104.3 kg)  09/13/20 229 lb 6.4 oz (104.1 kg)  09/05/20 231 lb 6.4 oz (105 kg)       Physical Exam Vitals reviewed.  Constitutional:      General: She is not in acute distress.    Appearance: She is well-developed. She is not diaphoretic.     Interventions: She is not intubated. HENT:     Head:  Normocephalic and atraumatic.     Right Ear: External ear normal.     Left Ear: External ear normal.     Nose: Nose normal.     Mouth/Throat:     Pharynx: No oropharyngeal exudate.  Eyes:     General: Lids are normal. No scleral icterus.       Right eye: No discharge.        Left eye: No discharge.     Conjunctiva/sclera: Conjunctivae normal.     Right eye: Right conjunctiva is not injected. No exudate or hemorrhage.    Left eye: Left conjunctiva is not injected. No exudate or hemorrhage.    Pupils: Pupils are equal, round, and reactive to light.  Neck:  Thyroid: No thyroid mass or thyromegaly.     Vascular: Normal carotid pulses. No carotid bruit, hepatojugular reflux or JVD.     Trachea: Trachea and phonation normal. No tracheal tenderness or tracheal deviation.     Meningeal: Brudzinski's sign and Kernig's sign absent.  Cardiovascular:     Rate and Rhythm: Normal rate and regular rhythm.     Pulses: Normal pulses.          Radial pulses are 2+ on the right side and 2+ on the left side.       Dorsalis pedis pulses are 2+ on the right side and 2+ on the left side.       Posterior tibial pulses are 2+ on the right side and 2+ on the left side.     Heart sounds: Normal heart sounds, S1 normal and S2 normal. Heart sounds not distant. No murmur heard. No friction rub. No gallop.   Pulmonary:     Effort: Pulmonary effort is normal. No tachypnea, bradypnea, accessory muscle usage or respiratory distress. She is not intubated.     Breath sounds: Normal breath sounds. No stridor. No wheezing or rales.  Chest:     Chest wall: No tenderness.  Breasts:     Right: No supraclavicular adenopathy.     Left: No supraclavicular adenopathy.    Abdominal:     General: Bowel sounds are normal. There is no distension or abdominal bruit.     Palpations: Abdomen is soft. There is no shifting dullness, fluid wave, hepatomegaly, splenomegaly, mass or pulsatile mass.     Tenderness: There is no  abdominal tenderness. There is no guarding or rebound.     Hernia: No hernia is present.  Musculoskeletal:        General: No tenderness or deformity. Normal range of motion.     Cervical back: Full passive range of motion without pain, normal range of motion and neck supple. No edema, erythema or rigidity. No spinous process tenderness or muscular tenderness. Normal range of motion.  Lymphadenopathy:     Head:     Right side of head: No submental, submandibular, tonsillar, preauricular, posterior auricular or occipital adenopathy.     Left side of head: No submental, submandibular, tonsillar, preauricular, posterior auricular or occipital adenopathy.     Cervical: No cervical adenopathy.     Right cervical: No superficial, deep or posterior cervical adenopathy.    Left cervical: No superficial, deep or posterior cervical adenopathy.     Upper Body:     Right upper body: No supraclavicular or pectoral adenopathy.     Left upper body: No supraclavicular or pectoral adenopathy.  Skin:    General: Skin is warm and dry.     Coloration: Skin is not pale.     Findings: No abrasion, bruising, burn, ecchymosis, erythema, lesion, petechiae or rash.     Nails: There is no clubbing.  Neurological:     Mental Status: She is alert and oriented to person, place, and time.     GCS: GCS eye subscore is 4. GCS verbal subscore is 5. GCS motor subscore is 6.     Cranial Nerves: No cranial nerve deficit.     Sensory: No sensory deficit.     Motor: No tremor, atrophy, abnormal muscle tone or seizure activity.     Coordination: Coordination normal.     Gait: Gait normal.     Deep Tendon Reflexes: Reflexes are normal and symmetric. Reflexes normal. Babinski sign absent on  the right side. Babinski sign absent on the left side.     Reflex Scores:      Tricep reflexes are 2+ on the right side and 2+ on the left side.      Bicep reflexes are 2+ on the right side and 2+ on the left side.      Brachioradialis  reflexes are 2+ on the right side and 2+ on the left side.      Patellar reflexes are 2+ on the right side and 2+ on the left side.      Achilles reflexes are 2+ on the right side and 2+ on the left side. Psychiatric:        Speech: Speech normal.        Behavior: Behavior normal.        Thought Content: Thought content normal.        Judgment: Judgment normal.       No results found for any visits on 09/26/20.  Assessment & Plan     Stress at home  Anxiety   [facility] called her and she will call them back to schedule psychiatry. She is dealing with anxiety much better now/ Goal discussed to eventual;ly decrease Xanax as Zoloft improves.   Meds ordered this encounter  Medications  . sertraline (ZOLOFT) 50 MG tablet    Sig: Take 1.5 tablets (75 mg total) by mouth daily.    Dispense:  180 tablet    Refill:  0    Dose change please fill.   Discussed known black box warning for anti depression/ anxiety medication. Need to report any behavioral changes right, if any homicidal or suicidal thoughts or ideas seek medical attention right away. Call 911.   Red Flags discussed. The patient was given clear instructions to go to ER or return to medical center if any red flags develop, symptoms do not improve, worsen or new problems develop. They verbalized understanding.    Return in about 1 month (around 10/27/2020), or if symptoms worsen or fail to improve, for at any time for any worsening symptoms, Go to Emergency room/ urgent care if worse.     The entirety of the information documented in the History of Present Illness, Review of Systems and Physical Exam were personally obtained by me. Portions of this information were initially documented by the CMA and reviewed by me for thoroughness and accuracy.      Jairo Ben, FNP  [facility] [phone] (phone) [phone] (fax)  [facility]

## 2020-09-26 NOTE — Patient Instructions (Signed)
[facility] in [place], [place] Address: [address] [place], [place] [address] Products and Services: [email or web address] Phone: [phone]  Sertraline Tablets What is this medicine? SERTRALINE (SER tra leen) is used to treat depression. It may also be used to treat obsessive compulsive disorder, panic disorder, post-trauma stress disorder, premenstrual dysphoric disorder (PMDD) or social anxiety. This medicine may be used for other purposes; ask your health care provider or pharmacist if you have questions. COMMON BRAND NAME(S): Zoloft What should I tell my health care provider before I take this medicine? They need to know if you have any of these conditions:  bleeding disorders  bipolar disorder or a family history of bipolar disorder  glaucoma  heart disease  high blood pressure  history of irregular heartbeat  history of low levels of calcium, magnesium, or potassium in the blood  if you often drink alcohol  liver disease  receiving electroconvulsive therapy  seizures  suicidal thoughts, plans, or attempt; a previous suicide attempt by you or a family member  take medicines that treat or prevent blood clots  thyroid disease  an unusual or allergic reaction to sertraline, other medicines, foods, dyes, or preservatives  pregnant or trying to get pregnant  breast-feeding How should I use this medicine? Take this medicine by mouth with a glass of water. Follow the directions on the prescription label. You can take it with or without food. Take your medicine at regular intervals. Do not take your medicine more often than directed. Do not stop taking this medicine suddenly except upon the advice of your doctor. Stopping this medicine too quickly may cause serious side effects or your condition may worsen. A special MedGuide will be given to you by the pharmacist with each prescription and refill. Be sure to read this  information carefully each time. Talk to your pediatrician regarding the use of this medicine in children. While this drug may be prescribed for children as young as 7 years for selected conditions, precautions do apply. Overdosage: If you think you have taken too much of this medicine contact a poison control center or emergency room at once. NOTE: This medicine is only for you. Do not share this medicine with others. What if I miss a dose? If you miss a dose, take it as soon as you can. If it is almost time for your next dose, take only that dose. Do not take double or extra doses. What may interact with this medicine? Do not take this medicine with any of the following medications:  cisapride  dronedarone  linezolid  MAOIs like Carbex, Eldepryl, Marplan, Nardil, and Parnate  methylene blue (injected into a vein)  pimozide  thioridazine This medicine may also interact with the following medications:  alcohol  amphetamines  aspirin and aspirin-like medicines  certain medicines for depression, anxiety, or psychotic disturbances  certain medicines for fungal infections like ketoconazole, fluconazole, posaconazole, and itraconazole  certain medicines for irregular heart beat like flecainide, quinidine, propafenone  certain medicines for migraine headaches like almotriptan, eletriptan, frovatriptan, naratriptan, rizatriptan, sumatriptan, zolmitriptan  certain medicines for sleep  certain medicines for seizures like carbamazepine, valproic acid, phenytoin  certain medicines that treat or prevent blood clots like warfarin, enoxaparin, dalteparin  cimetidine  digoxin  diuretics  fentanyl  isoniazid  lithium  NSAIDs, medicines for pain and inflammation, like ibuprofen or naproxen  other medicines that prolong the QT interval (cause an abnormal heart rhythm) like dofetilide  rasagiline  safinamide  supplements  like St. John's wort, kava kava,  valerian  tolbutamide  tramadol  tryptophan This list may not describe all possible interactions. Give your health care provider a list of all the medicines, herbs, non-prescription drugs, or dietary supplements you use. Also tell them if you smoke, drink alcohol, or use illegal drugs. Some items may interact with your medicine. What should I watch for while using this medicine? Tell your doctor if your symptoms do not get better or if they get worse. Visit your doctor or health care professional for regular checks on your progress. Because it may take several weeks to see the full effects of this medicine, it is important to continue your treatment as prescribed by your doctor. Patients and their families should watch out for new or worsening thoughts of suicide or depression. Also watch out for sudden changes in feelings such as feeling anxious, agitated, panicky, irritable, hostile, aggressive, impulsive, severely restless, overly excited and hyperactive, or not being able to sleep. If this happens, especially at the beginning of treatment or after a change in dose, call your health care professional. Allison Moss may get drowsy or dizzy. Do not drive, use machinery, or do anything that needs mental alertness until you know how this medicine affects you. Do not stand or sit up quickly, especially if you are an older patient. This reduces the risk of dizzy or fainting spells. Alcohol may interfere with the effect of this medicine. Avoid alcoholic drinks. Your mouth may get dry. Chewing sugarless gum or sucking hard candy, and drinking plenty of water may help. Contact your doctor if the problem does not go away or is severe. What side effects may I notice from receiving this medicine? Side effects that you should report to your doctor or health care professional as soon as possible:  allergic reactions like skin rash, itching or hives, swelling of the face, lips, or tongue  anxious  black, tarry  stools  changes in vision  confusion  elevated mood, decreased need for sleep, racing thoughts, impulsive behavior  eye pain  fast, irregular heartbeat  feeling faint or lightheaded, falls  feeling agitated, angry, or irritable  hallucination, loss of contact with reality  loss of balance or coordination  loss of memory  painful or prolonged erections  restlessness, pacing, inability to keep still  seizures  stiff muscles  suicidal thoughts or other mood changes  trouble sleeping  unusual bleeding or bruising  unusually weak or tired  vomiting Side effects that usually do not require medical attention (report to your doctor or health care professional if they continue or are bothersome):  change in appetite or weight  change in sex drive or performance  diarrhea  increased sweating  indigestion, nausea  tremors This list may not describe all possible side effects. Call your doctor for medical advice about side effects. You may report side effects to FDA at 1-800-FDA-1088. Where should I keep my medicine? Keep out of the reach of children. Store at room temperature between 15 and 30 degrees C (59 and 86 degrees F). Throw away any unused medicine after the expiration date. NOTE: This sheet is a summary. It may not cover all possible information. If you have questions about this medicine, talk to your doctor, pharmacist, or health care provider.  2021 Elsevier/Gold Standard (2020-05-02 09:18:23)

## 2020-10-07 ENCOUNTER — Other Ambulatory Visit: Payer: Self-pay | Admitting: Adult Health

## 2020-10-07 MED ORDER — ALPRAZOLAM 0.5 MG PO TABS: 0.5000 mg | ORAL_TABLET | Freq: Three times a day (TID) | ORAL | 0 refills | Status: DC | PRN

## 2020-10-25 ENCOUNTER — Other Ambulatory Visit: Payer: Self-pay | Admitting: Physician Assistant

## 2020-10-28 ENCOUNTER — Other Ambulatory Visit: Payer: Self-pay | Admitting: Adult Health

## 2020-10-28 MED ORDER — SERTRALINE HCL 50 MG PO TABS: 75.0000 mg | ORAL_TABLET | Freq: Every day | ORAL | 0 refills | Status: DC

## 2020-10-28 MED ORDER — ALPRAZOLAM 0.5 MG PO TABS: 0.5000 mg | ORAL_TABLET | Freq: Three times a day (TID) | ORAL | 0 refills | Status: DC | PRN

## 2020-11-04 ENCOUNTER — Other Ambulatory Visit: Payer: Self-pay

## 2020-11-04 ENCOUNTER — Encounter: Payer: Self-pay | Admitting: Adult Health

## 2020-11-04 ENCOUNTER — Ambulatory Visit: Payer: No Typology Code available for payment source | Admitting: Adult Health

## 2020-11-04 VITALS — BP 132/92 | HR 89 | Temp 97.3°F | Ht 69.02 in | Wt 232.4 lb

## 2020-11-04 DIAGNOSIS — F439 Reaction to severe stress, unspecified: Secondary | ICD-10-CM | POA: Diagnosis not present

## 2020-11-04 DIAGNOSIS — F17209 Nicotine dependence, unspecified, with unspecified nicotine-induced disorders: Secondary | ICD-10-CM

## 2020-11-04 DIAGNOSIS — G4700 Insomnia, unspecified: Secondary | ICD-10-CM

## 2020-11-04 DIAGNOSIS — Z8709 Personal history of other diseases of the respiratory system: Secondary | ICD-10-CM

## 2020-11-04 DIAGNOSIS — F419 Anxiety disorder, unspecified: Secondary | ICD-10-CM

## 2020-11-04 MED ORDER — SERTRALINE HCL 100 MG PO TABS: 100.0000 mg | ORAL_TABLET | Freq: Every day | ORAL | 0 refills | Status: DC

## 2020-11-04 MED ORDER — TRAZODONE HCL 50 MG PO TABS: 25.0000 mg | ORAL_TABLET | Freq: Every evening | ORAL | 1 refills | Status: DC | PRN

## 2020-11-04 MED ORDER — ALBUTEROL SULFATE HFA 108 (90 BASE) MCG/ACT IN AERS
1.0000 | INHALATION_SPRAY | Freq: Four times a day (QID) | RESPIRATORY_TRACT | 1 refills | Status: DC | PRN
Start: 2020-11-04 — End: 2022-10-16

## 2020-11-04 MED ORDER — ADVAIR HFA 115-21 MCG/ACT IN AERO: 2.0000 | INHALATION_SPRAY | Freq: Two times a day (BID) | RESPIRATORY_TRACT | 12 refills | Status: DC

## 2020-11-04 NOTE — Patient Instructions (Addendum)
[email or web address]">  Generalized Anxiety Disorder, Adult Generalized anxiety disorder (GAD) is a mental health condition. Unlike normal worries, anxiety related to GAD is not triggered by a specific event. These worries do not fade or get better with time. GAD interferes with relationships, work, and school. GAD symptoms can vary from mild to severe. People with severe GAD can have intense waves of anxiety with physical symptoms that are similar to panic attacks. What are the causes? The exact cause of GAD is not known, but the following are believed to have an impact:  Differences in natural brain chemicals.  Genes passed down from parents to children.  Differences in the way threats are perceived.  Development during childhood.  Personality. What increases the risk? The following factors may make you more likely to develop this condition:  Being female.  Having a family history of anxiety disorders.  Being very shy.  Experiencing very stressful life events, such as the death of a loved one.  Having a very stressful family environment. What are the signs or symptoms? People with GAD often worry excessively about many things in their lives, such as their health and family. Symptoms may also include:  Mental and emotional symptoms: ? Worrying excessively about natural disasters. ? Fear of being late. ? Difficulty concentrating. ? Fears that others are judging your performance.  Physical symptoms: ? Fatigue. ? Headaches, muscle tension, muscle twitches, trembling, or feeling shaky. ? Feeling like your heart is pounding or beating very fast. ? Feeling out of breath or like you cannot take a deep breath. ? Having trouble falling asleep or staying asleep, or experiencing restlessness. ? Sweating. ? Nausea, diarrhea, or irritable bowel syndrome (IBS).  Behavioral symptoms: ? Experiencing erratic moods or irritability. ? Avoidance of new situations. ? Avoidance of  people. ? Extreme difficulty making decisions. How is this diagnosed? This condition is diagnosed based on your symptoms and medical history. You will also have a physical exam. Your health care provider may perform tests to rule out other possible causes of your symptoms. To be diagnosed with GAD, a person must have anxiety that:  Is out of his or her control.  Affects several different aspects of his or her life, such as work and relationships.  Causes distress that makes him or her unable to take part in normal activities.  Includes at least three symptoms of GAD, such as restlessness, fatigue, trouble concentrating, irritability, muscle tension, or sleep problems. Before your health care provider can confirm a diagnosis of GAD, these symptoms must be present more days than they are not, and they must last for 6 months or longer. How is this treated? This condition may be treated with:  Medicine. Antidepressant medicine is usually prescribed for long-term daily control. Anti-anxiety medicines may be added in severe cases, especially when panic attacks occur.  Talk therapy (psychotherapy). Certain types of talk therapy can be helpful in treating GAD by providing support, education, and guidance. Options include: ? Cognitive behavioral therapy (CBT). People learn coping skills and self-calming techniques to ease their physical symptoms. They learn to identify unrealistic thoughts and behaviors and to replace them with more appropriate thoughts and behaviors. ? Acceptance and commitment therapy (ACT). This treatment teaches people how to be mindful as a way to cope with unwanted thoughts and feelings. ? Biofeedback. This process trains you to manage your body's response (physiological response) through breathing techniques and relaxation methods. You will work with a therapist while machines are used to monitor your physical   symptoms.  Stress management techniques. These include yoga,  meditation, and exercise. A mental health specialist can help determine which treatment is best for you. Some people see improvement with one type of therapy. However, other people require a combination of therapies.   Follow these instructions at home: Lifestyle  Maintain a consistent routine and schedule.  Anticipate stressful situations. Create a plan, and allow extra time to work with your plan.  Practice stress management or self-calming techniques that you have learned from your therapist or your health care provider. General instructions  Take over-the-counter and prescription medicines only as told by your health care provider.  Understand that you are likely to have setbacks. Accept this and be kind to yourself as you persist to take better care of yourself.  Recognize and accept your accomplishments, even if you judge them as small.  Keep all follow-up visits as told by your health care provider. This is important. Contact a health care provider if:  Your symptoms do not get better.  Your symptoms get worse.  You have signs of depression, such as: ? A persistently sad or irritable mood. ? Loss of enjoyment in activities that used to bring you joy. ? Change in weight or eating. ? Changes in sleeping habits. ? Avoiding friends or family members. ? Loss of energy for normal tasks. ? Feelings of guilt or worthlessness. Get help right away if:  You have serious thoughts about hurting yourself or others. If you ever feel like you may hurt yourself or others, or have thoughts about taking your own life, get help right away. Go to your nearest emergency department or:  Call your local emergency services (911 in the [place]  Call a suicide crisis helpline, such as the National Suicide Prevention Lifeline at 413 401 1585. This is open 24 hours a day in the [place]  Text the Crisis Text Line at 763 385 5469 (in the [place] Summary  Generalized anxiety disorder (GAD) is a mental  health condition that involves worry that is not triggered by a specific event.  People with GAD often worry excessively about many things in their lives, such as their health and family.  GAD may cause symptoms such as restlessness, trouble concentrating, sleep problems, frequent sweating, nausea, diarrhea, headaches, and trembling or muscle twitching.  A mental health specialist can help determine which treatment is best for you. Some people see improvement with one type of therapy. However, other people require a combination of therapies. This information is not intended to replace advice given to you by your health care provider. Make sure you discuss any questions you have with your health care provider. Document Revised: 04/12/2019 Document Reviewed: 04/12/2019 Elsevier Patient Education  2021 [employer].   Psychiatric/Counseling Resources Discussed As Follows:  If Emergency please seek Emergency Room Care Immediately or Call 911.   [facility] Address:  [address] [place], [place] [address] Phone: [phone] Website : [email or web address]   [facility]  Address:  [address] [place], [place] [address] Phone: [phone] Fax: [phone] Website: [email or web address] How To Access Our Services Because our main goal is to meet the needs of our consumers, [facility] operates on a walk-in basis! To access services, there are just 3 easy steps: 1) Walk in any Monday, Wednesday or Friday between 8:00 am and 3:00 pm and complete our consumer paperwork 2) A Comprehensive Clinical Assessment (CCA) will be completed and appropriate service recommendations will be provided 3) Recommendations are sent to [facility] team members and  the appropriate staff will call you within days. Advanced Access Open M - F, 8:00 am - 8:00 pm  Mental health crisis services for all age groups  Triage  Psychiatric Evaluations  Involuntary  Commitments  [facility]  Address: [address] [place], [place], [place] [address] Website : [email or web address] Walk in's accepted see web site or call for more information Phone : [phone] Also has [facility] [phone]    Psychology Today Find a therapist by searching online in your area or specialist by your diagnosis Website:  [email or web address]   Trazodone Tablets What is this medicine? TRAZODONE (TRAZ oh done) is used to treat depression. This medicine may be used for other purposes; ask your health care provider or pharmacist if you have questions. COMMON BRAND NAME(S): Desyrel What should I tell my health care provider before I take this medicine? They need to know if you have any of these conditions:  attempted suicide or thinking about it  bipolar disorder  bleeding problems  glaucoma  heart disease, or previous heart attack  irregular heart beat  kidney or liver disease  low levels of sodium in the blood  an unusual or allergic reaction to trazodone, other medicines, foods, dyes or preservatives  pregnant or trying to get pregnant  breast-feeding How should I use this medicine? Take this medicine by mouth with a glass of water. Follow the directions on the prescription label. Take this medicine shortly after a meal or a light snack. Take your medicine at regular intervals. Do not take your medicine more often than directed. Do not stop taking this medicine suddenly except upon the advice of your doctor. Stopping this medicine too quickly may cause serious side effects or your condition may worsen. A special MedGuide will be given to you by the pharmacist with each prescription and refill. Be sure to read this information carefully each time. Talk to your pediatrician regarding the use of this medicine in children. Special care may be needed. Overdosage: If you think you have taken  too much of this medicine contact a poison control center or emergency room at once. NOTE: This medicine is only for you. Do not share this medicine with others. What if I miss a dose? If you miss a dose, take it as soon as you can. If it is almost time for your next dose, take only that dose. Do not take double or extra doses. What may interact with this medicine? Do not take this medicine with any of the following medications:  certain medicines for fungal infections like fluconazole, itraconazole, ketoconazole, posaconazole, voriconazole  cisapride  dronedarone  linezolid  MAOIs like Carbex, Eldepryl, Marplan, Nardil, and Parnate  mesoridazine  methylene blue (injected into a vein)  pimozide  saquinavir  thioridazine This medicine may also interact with the following medications:  alcohol  antiviral medicines for HIV or AIDS  aspirin and aspirin-like medicines  barbiturates like phenobarbital  certain medicines for blood pressure, heart disease, irregular heart beat  certain medicines for depression, anxiety, or psychotic disturbances  certain medicines for migraine headache like almotriptan, eletriptan, frovatriptan, naratriptan, rizatriptan, sumatriptan, zolmitriptan  certain medicines for seizures like carbamazepine and phenytoin  certain medicines for sleep  certain medicines that treat or prevent blood clots like dalteparin, enoxaparin, warfarin  digoxin  fentanyl  lithium  NSAIDS, medicines for pain and inflammation, like ibuprofen or naproxen  other medicines that prolong the QT interval (cause an abnormal heart rhythm) like dofetilide  rasagiline  supplements like St. John's wort, kava kava, valerian  tramadol  tryptophan This list may not describe all possible interactions. Give your health care provider a list of all the medicines, herbs, non-prescription drugs, or dietary supplements you use. Also tell them if you smoke, drink alcohol, or  use illegal drugs. Some items may interact with your medicine. What should I watch for while using this medicine? Tell your doctor if your symptoms do not get better or if they get worse. Visit your doctor or health care professional for regular checks on your progress. Because it may take several weeks to see the full effects of this medicine, it is important to continue your treatment as prescribed by your doctor. Patients and their families should watch out for new or worsening thoughts of suicide or depression. Also watch out for sudden changes in feelings such as feeling anxious, agitated, panicky, irritable, hostile, aggressive, impulsive, severely restless, overly excited and hyperactive, or not being able to sleep. If this happens, especially at the beginning of treatment or after a change in dose, call your health care professional. Bonita Quin may get drowsy or dizzy. Do not drive, use machinery, or do anything that needs mental alertness until you know how this medicine affects you. Do not stand or sit up quickly, especially if you are an older patient. This reduces the risk of dizzy or fainting spells. Alcohol may interfere with the effect of this medicine. Avoid alcoholic drinks. This medicine may cause dry eyes and blurred vision. If you wear contact lenses you may feel some discomfort. Lubricating drops may help. See your eye doctor if the problem does not go away or is severe. Your mouth may get dry. Chewing sugarless gum, sucking hard candy and drinking plenty of water may help. Contact your doctor if the problem does not go away or is severe. What side effects may I notice from receiving this medicine? Side effects that you should report to your doctor or health care professional as soon as possible:  allergic reactions like skin rash, itching or hives, swelling of the face, lips, or tongue  elevated mood, decreased need for sleep, racing thoughts, impulsive behavior  confusion  fast,  irregular heartbeat  feeling faint or lightheaded, falls  feeling agitated, angry, or irritable  loss of balance or coordination  painful or prolonged erections  restlessness, pacing, inability to keep still  suicidal thoughts or other mood changes  tremors  trouble sleeping  seizures  unusual bleeding or bruising Side effects that usually do not require medical attention (report to your doctor or health care professional if they continue or are bothersome):  change in sex drive or performance  change in appetite or weight  constipation  headache  muscle aches or pains  nausea This list may not describe all possible side effects. Call your doctor for medical advice about side effects. You may report side effects to FDA at 1-800-FDA-1088. Where should I keep my medicine? Keep out of the reach of children. Store at room temperature between 15 and 30 degrees C (59 to 86 degrees F). Protect from light. Keep container tightly closed. Throw away any unused medicine after the expiration date. NOTE: This sheet is a summary. It may not cover all possible information. If you have questions about this medicine, talk to your doctor, pharmacist, or health care provider.  2021 Elsevier/Gold Standard (2020-05-13 14:46:11)

## 2020-11-04 NOTE — Progress Notes (Signed)
Established Patient Office Visit  Subjective:  Patient ID: Allison Moss, female    DOB: September 24, 1980  Age: 40 y.o. MRN: [id number]  CC:  Chief Complaint  Patient presents with  . Anxiety    Pt c/o anxiety x2-65month. Pt wants more time off of work.. Pt states she is having increased heart rate and anxious thoughts.   . Insomnia    HPI Allison TRIMBLEpresents for anxiety, she has been doing better at last visit. She reports she is now not sleeping as she has had a lot of back and forth arguing with her husband - ex that left her over her son. This has increased her stress again. Her FMLA for this stress. Anxiety and depression ends today 11/04/20- she would like to extend this.   Denies any suicidal or homicidal thoughts or intents. She has a lot of tearfulness.   Taking xanax three times a day mostly. She is doing counselor with her pastor. Referral denied to  Psychiatry at this time, she is not opposed to it but feels her pastor and good support with friends helping more.   Zoloft was increased to 75 mg at last visit she does report she increased the dose and is not missing any doses.   She " feels like I should be over it " she says.  Patient  denies any fever, body aches,chills, rash, chest pain, shortness of breath, nausea, vomiting, or diarrhea.  Denies dizziness, lightheadedness, pre syncopal or syncopal episodes.   Would like to be out of work from 11/05/2020 to July 1,2022 to deal with her overall increase in anxiety.    Past Medical History:  Diagnosis Date  . Arthritis    Rheumatoid and Psoriatic  . Asthma     Past Surgical History:  Procedure Laterality Date  . APPENDECTOMY    . CESAREAN SECTION     x2  . KNEE ARTHROSCOPY Left    x2    Family History  Problem Relation Age of Onset  . Hypothyroidism Mother   . Lung cancer Maternal Grandmother   . Hypertension Maternal Grandfather   . Arrhythmia Maternal Grandfather     Social History   Socioeconomic  History  . Marital status: Married    Spouse name: Not on file  . Number of children: Not on file  . Years of education: Not on file  . Highest education level: Not on file  Occupational History  . Not on file  Tobacco Use  . Smoking status: Current Some Day Smoker    Packs/day: 1.00    Types: Cigarettes  . Smokeless tobacco: Never Used  . Tobacco comment: cuts back to 2 cigs daily   Vaping Use  . Vaping Use: Never used  Substance and Sexual Activity  . Alcohol use: No    Alcohol/week: 0.0 standard drinks    Comment: rare  . Drug use: No  . Sexual activity: Not on file  Other Topics Concern  . Not on file  Social History Narrative  . Not on file   Social Determinants of Health   Financial Resource Strain: Not on file  Food Insecurity: Not on file  Transportation Needs: Not on file  Physical Activity: Not on file  Stress: Not on file  Social Connections: Not on file  Intimate Partner Violence: Not on file    Outpatient Medications Prior to Visit  Medication Sig Dispense Refill  . ALPRAZolam (XANAX) 0.5 MG tablet Take 1-2 tablets (0.5-1 mg total)  by mouth 3 (three) times daily as needed for anxiety (will cause drowsiness.). 90 tablet 0  . albuterol (VENTOLIN HFA) 108 (90 Base) MCG/ACT inhaler Inhale 1 puff into the lungs every 6 (six) hours as needed for wheezing or shortness of breath.    . sertraline (ZOLOFT) 50 MG tablet Take 1.5 tablets (75 mg total) by mouth daily. 135 tablet 0   No facility-administered medications prior to visit.    Allergies  Allergen Reactions  . Acetaminophen-Codeine Itching  . Escitalopram Hives  . Escitalopram Oxalate   . Hydrocodone-Acetaminophen Nausea Only  . Hydrocodone-Acetaminophen   . Other   . Propoxyphene Hives  . Tramadol Hives    ROS Review of Systems  Constitutional: Negative.   HENT: Negative.   Respiratory: Negative.   Cardiovascular: Negative.   Gastrointestinal: Negative.   Genitourinary: Negative.    Musculoskeletal: Negative.   Neurological: Negative.   Hematological: Negative.   Psychiatric/Behavioral: Positive for agitation, decreased concentration and sleep disturbance. Negative for behavioral problems, confusion, dysphoric mood, hallucinations, self-injury and suicidal ideas. The patient is nervous/anxious. The patient is not hyperactive.       Objective:    Physical Exam Vitals and nursing note reviewed.  Constitutional:      General: She is not in acute distress.    Appearance: Normal appearance. She is not ill-appearing, toxic-appearing or diaphoretic.  HENT:     Head: Normocephalic and atraumatic.     Right Ear: External ear normal.     Left Ear: External ear normal.     Nose: Nose normal.     Mouth/Throat:     Mouth: Mucous membranes are moist.  Eyes:     Conjunctiva/sclera: Conjunctivae normal.  Cardiovascular:     Rate and Rhythm: Normal rate and regular rhythm.     Pulses: Normal pulses.     Heart sounds: Normal heart sounds. No murmur heard. No friction rub. No gallop.   Pulmonary:     Effort: Pulmonary effort is normal. No respiratory distress.     Breath sounds: No stridor. Examination of the right-upper field reveals wheezing. Examination of the left-upper field reveals wheezing. Wheezing present. No rhonchi or rales.     Comments: Very mild intermittent expiratory wheezing.  Chest:     Chest wall: No tenderness.  Abdominal:     Palpations: Abdomen is soft.  Musculoskeletal:        General: Normal range of motion.     Cervical back: Normal range of motion and neck supple.  Lymphadenopathy:     Cervical: No cervical adenopathy.  Skin:    General: Skin is warm.     Findings: No erythema or rash.  Neurological:     General: No focal deficit present.     Mental Status: She is alert and oriented to person, place, and time.     Motor: No weakness.     Gait: Gait normal.  Psychiatric:        Attention and Perception: Attention normal.        Mood and  Affect: Mood normal. Affect is tearful.        Speech: Speech normal.        Behavior: Behavior normal. Behavior is cooperative.        Thought Content: Thought content normal.        Judgment: Judgment normal.     Comments: Very tearful again, still improved from initial visit.      BP (!) 132/92 (BP Location: Left Arm, Patient Position:  Sitting)   Pulse 89   Temp (!) 97.3 F (36.3 C)   Ht 5' 9.02" (1.753 m)   Wt 232 lb 6.4 oz (105.4 kg)   SpO2 98%   BMI 34.30 kg/m  Wt Readings from Last 3 Encounters:  11/04/20 232 lb 6.4 oz (105.4 kg)  09/26/20 229 lb 14.4 oz (104.3 kg)  09/13/20 229 lb 6.4 oz (104.1 kg)     Health Maintenance Due  Topic Date Due  . Hepatitis C Screening  Never done    There are no preventive care reminders to display for this patient.  Lab Results  Component Value Date   TSH 3.890 09/05/2020   Lab Results  Component Value Date   WBC 8.3 09/05/2020   HGB 13.3 09/05/2020   HCT 38.5 09/05/2020   MCV 93 09/05/2020   PLT 326 09/05/2020   Lab Results  Component Value Date   NA 139 09/05/2020   K 4.7 09/05/2020   CO2 20 09/05/2020   GLUCOSE 87 09/05/2020   BUN 14 09/05/2020   CREATININE 0.73 09/05/2020   BILITOT 0.8 09/05/2020   ALKPHOS 98 09/05/2020   AST 9 09/05/2020   ALT 11 09/05/2020   PROT 7.1 09/05/2020   ALBUMIN 4.7 09/05/2020   CALCIUM 9.9 09/05/2020   ANIONGAP 9 06/10/2020   EGFR 108 09/05/2020   No results found for: CHOL No results found for: HDL No results found for: LDLCALC No results found for: TRIG No results found for: CHOLHDL Lab Results  Component Value Date   HGBA1C 5.2 11/23/2017      Assessment & Plan:   Problem List Items Addressed This Visit      Other   History of asthma   Stress at home   Relevant Medications   sertraline (ZOLOFT) 100 MG tablet   Anxiety - Primary   Relevant Medications   sertraline (ZOLOFT) 100 MG tablet   traZODone (DESYREL) 50 MG tablet    Other Visit Diagnoses     Insomnia, unspecified type       Relevant Medications   traZODone (DESYREL) 50 MG tablet   Tobacco use disorder, continuous         Meds ordered this encounter  Medications  . sertraline (ZOLOFT) 100 MG tablet    Sig: Take 1 tablet (100 mg total) by mouth daily.    Dispense:  90 tablet    Refill:  0    Please hold until patient calls.  . traZODone (DESYREL) 50 MG tablet    Sig: Take 0.5-1 tablets (25-50 mg total) by mouth at bedtime as needed for sleep.    Dispense:  90 tablet    Refill:  1  . albuterol (VENTOLIN HFA) 108 (90 Base) MCG/ACT inhaler    Sig: Inhale 1 puff into the lungs every 6 (six) hours as needed for wheezing or shortness of breath.    Dispense:  1 each    Refill:  1  . fluticasone-salmeterol (ADVAIR HFA) 115-21 MCG/ACT inhaler    Sig: Inhale 2 puffs into the lungs 2 (two) times daily. Rinse mouth after use.    Dispense:  1 each    Refill:  12    Smoking cessation instruction/counseling given:  counseled patient on the dangers of tobacco use, advised patient to stop smoking, and reviewed strategies to maximize success Will try the Trazodone as prescribed for sleep and the Zoloft increased to 100 mg po qd, with expectation to decrease xanax and Trazodone as stress decreases and  as the Zoloft reaches full effectiveness.   She is a smoker and has some mild asthma, she has lost her albuterol and notices pollen is bothering her. She was also sent in Advair inhaler to use as prescribed,   Discontinued Zoloft 1.5 tablets was taking 75 mg daily.  Medications Discontinued During This Encounter  Medication Reason  . sertraline (ZOLOFT) 50 MG tablet   . albuterol (VENTOLIN HFA) 108 (90 Base) MCG/ACT inhaler Reorder   Meds ordered this encounter  Medications  . sertraline (ZOLOFT) 100 MG tablet    Sig: Take 1 tablet (100 mg total) by mouth daily.    Dispense:  90 tablet    Refill:  0    Please hold until patient calls.  . traZODone (DESYREL) 50 MG tablet    Sig:  Take 0.5-1 tablets (25-50 mg total) by mouth at bedtime as needed for sleep.    Dispense:  90 tablet    Refill:  1  . albuterol (VENTOLIN HFA) 108 (90 Base) MCG/ACT inhaler    Sig: Inhale 1 puff into the lungs every 6 (six) hours as needed for wheezing or shortness of breath.    Dispense:  1 each    Refill:  1  . fluticasone-salmeterol (ADVAIR HFA) 115-21 MCG/ACT inhaler    Sig: Inhale 2 puffs into the lungs 2 (two) times daily. Rinse mouth after use.    Dispense:  1 each    Refill:  12   Discussed known black box warning for anti depression/ anxiety medication. Need to report any behavioral changes right, if any homicidal or suicidal thoughts or ideas seek medical attention right away. Call 911.  Red Flags discussed. The patient was given clear instructions to go to ER or return to medical center if any red flags develop, symptoms do not improve, worsen or new problems develop. They verbalized understanding. Relaxation techniques advised, emergency psychiatry resources / clinicsinformation given in AVS.  Will fill out FMLA forms out from 11/05/20 to July 1,2022  Follow-up: Return in about 1 month (around 12/05/2020), or if symptoms worsen or fail to improve, for Go to Emergency room/ urgent care if worse, at any time for any worsening symptoms.    Marcille Buffy, FNP

## 2020-11-08 ENCOUNTER — Encounter: Payer: Self-pay | Admitting: Adult Health

## 2020-11-11 ENCOUNTER — Telehealth: Payer: Self-pay

## 2020-11-11 NOTE — Telephone Encounter (Signed)
Pt would like to request an increase in her ALPRAZolam (XANAX) 0.5 MG tablet dosage. She would not say if she had increased symptoms. Pt was in lobby in front of other patients.  Please advise

## 2020-11-11 NOTE — Telephone Encounter (Signed)
Pt dropped off paperwork from [facility]. Placed in folder up front. She asked it to be faxed over to number on form

## 2020-11-12 ENCOUNTER — Other Ambulatory Visit: Payer: Self-pay | Admitting: Adult Health

## 2020-11-12 MED ORDER — ALPRAZOLAM 1 MG PO TABS: 1.0000 mg | ORAL_TABLET | Freq: Three times a day (TID) | ORAL | 0 refills | Status: DC | PRN

## 2020-11-12 NOTE — Progress Notes (Signed)
No orders of the defined types were placed in this encounter.  Meds ordered this encounter  Medications  . ALPRAZolam (XANAX) 1 MG tablet    Sig: Take 1 tablet (1 mg total) by mouth 3 (three) times daily as needed for anxiety (use only as needed.).    Dispense:  90 tablet    Refill:  0

## 2020-11-12 NOTE — Telephone Encounter (Addendum)
Paperwork has been completed and faxed to [employer] at number on form - [phone]

## 2020-11-12 NOTE — Telephone Encounter (Signed)
Xanax message was addressed by Provider on Patient message from 11/08/2020. Please see note.

## 2020-11-20 ENCOUNTER — Telehealth: Payer: Self-pay | Admitting: Adult Health

## 2020-11-20 NOTE — Telephone Encounter (Signed)
Called and spoke with Allison Moss. She states that she contacted [facility] and was told that they are not accepting any new patients. She wanted to see a new office as soon as possible as it is affecting her Short Term Disability. I informed her that we can make a more accurate referral if she were to contact her insurance and find someone that is covered by her insurance. She declined calling her insurance and wanted [place] to send a general referral to someone other than [facility].

## 2020-11-20 NOTE — Telephone Encounter (Signed)
Patient called in stated that [facility] are not take any new patient and she stated that this is affecting her disability paperwork

## 2020-11-24 ENCOUNTER — Other Ambulatory Visit: Payer: Self-pay | Admitting: Adult Health

## 2020-11-24 DIAGNOSIS — F419 Anxiety disorder, unspecified: Secondary | ICD-10-CM

## 2020-11-24 NOTE — Telephone Encounter (Signed)
Placed referral.She should hear within 1 week and if she does not please let me know.

## 2020-11-25 NOTE — Telephone Encounter (Signed)
Called patient to let her know referral was placed . Patient verbalized understanding.

## 2020-12-05 ENCOUNTER — Ambulatory Visit (INDEPENDENT_AMBULATORY_CARE_PROVIDER_SITE_OTHER): Payer: No Typology Code available for payment source | Admitting: Adult Health

## 2020-12-05 ENCOUNTER — Encounter: Payer: Self-pay | Admitting: Adult Health

## 2020-12-05 ENCOUNTER — Other Ambulatory Visit: Payer: Self-pay

## 2020-12-05 VITALS — BP 122/84 | HR 76 | Temp 98.0°F | Ht 69.02 in | Wt 219.6 lb

## 2020-12-05 DIAGNOSIS — F439 Reaction to severe stress, unspecified: Secondary | ICD-10-CM

## 2020-12-05 DIAGNOSIS — F419 Anxiety disorder, unspecified: Secondary | ICD-10-CM

## 2020-12-05 DIAGNOSIS — F322 Major depressive disorder, single episode, severe without psychotic features: Secondary | ICD-10-CM

## 2020-12-05 DIAGNOSIS — G4700 Insomnia, unspecified: Secondary | ICD-10-CM | POA: Diagnosis not present

## 2020-12-05 DIAGNOSIS — F4321 Adjustment disorder with depressed mood: Secondary | ICD-10-CM

## 2020-12-05 MED ORDER — ALPRAZOLAM 1 MG PO TABS: 1.0000 mg | ORAL_TABLET | Freq: Two times a day (BID) | ORAL | 0 refills | Status: DC

## 2020-12-05 NOTE — Telephone Encounter (Signed)
Paperwork has been printed and are being filled out.

## 2020-12-05 NOTE — Progress Notes (Signed)
Acute Office Visit  Subjective:    Patient ID: MISK GALENTINE, female    DOB: [birth year], 40 y.o.   MRN: [id number]  Chief Complaint  Patient presents with  . Follow-up    HPI Patient is in today for follow up on anxiety, she has days she is still anxious and nervous at times.  Her husband left her.  She feels like the medication is helping, she is not crying as much.  She is still out of work on leave due to her emotional state. She is having less stressful days and less tearfulness, still has her bad days " but I feel I am coping better" still  Has some bad days where she feels irritable.  Denies any suicidal or homicidal ideations or intents.  She has an appointment with [facility] July 7th 2022.   She is sleeping with Trazodone much better, Zoloft is helping at 100 mg. She has decreased Xanax use now, since antidepressant is helping more and Trazodone.  Patient  denies any fever, body aches,chills, rash, chest pain, shortness of breath, nausea, vomiting, or diarrhea.  Denies dizziness, lightheadedness, pre syncopal or syncopal episodes.   Patient's last menstrual period was 11/12/2020.   Past Medical History:  Diagnosis Date  . Arthritis    Rheumatoid and Psoriatic  . Asthma     Past Surgical History:  Procedure Laterality Date  . APPENDECTOMY    . CESAREAN SECTION     x2  . KNEE ARTHROSCOPY Left    x2    Family History  Problem Relation Age of Onset  . Hypothyroidism Mother   . Lung cancer Maternal Grandmother   . Hypertension Maternal Grandfather   . Arrhythmia Maternal Grandfather     Social History   Socioeconomic History  . Marital status: Married    Spouse name: Not on file  . Number of children: Not on file  . Years of education: Not on file  . Highest education level: Not on file  Occupational History  . Not on file  Tobacco Use  . Smoking status: Current Some Day Smoker    Packs/day: 1.00    Types: Cigarettes  . Smokeless  tobacco: Never Used  . Tobacco comment: cuts back to 2 cigs daily   Vaping Use  . Vaping Use: Never used  Substance and Sexual Activity  . Alcohol use: No    Alcohol/week: 0.0 standard drinks    Comment: rare  . Drug use: No  . Sexual activity: Not on file  Other Topics Concern  . Not on file  Social History Narrative  . Not on file   Social Determinants of Health   Financial Resource Strain: Not on file  Food Insecurity: Not on file  Transportation Needs: Not on file  Physical Activity: Not on file  Stress: Not on file  Social Connections: Not on file  Intimate Partner Violence: Not on file    Outpatient Medications Prior to Visit  Medication Sig Dispense Refill  . albuterol (VENTOLIN HFA) 108 (90 Base) MCG/ACT inhaler Inhale 1 puff into the lungs every 6 (six) hours as needed for wheezing or shortness of breath. 1 each 1  . fluticasone-salmeterol (ADVAIR HFA) 115-21 MCG/ACT inhaler Inhale 2 puffs into the lungs 2 (two) times daily. Rinse mouth after use. 1 each 12  . sertraline (ZOLOFT) 100 MG tablet Take 1 tablet (100 mg total) by mouth daily. 90 tablet 0  . traZODone (DESYREL) 50 MG tablet Take 0.5-1 tablets (25-50  mg total) by mouth at bedtime as needed for sleep. 90 tablet 1  . ALPRAZolam (XANAX) 1 MG tablet Take 1 tablet (1 mg total) by mouth 3 (three) times daily as needed for anxiety (use only as needed.). 90 tablet 0   No facility-administered medications prior to visit.    Allergies  Allergen Reactions  . Acetaminophen-Codeine Itching  . Escitalopram Hives  . Escitalopram Oxalate   . Hydrocodone-Acetaminophen Nausea Only  . Hydrocodone-Acetaminophen   . Other   . Propoxyphene Hives  . Tramadol Hives    Review of Systems  Constitutional: Positive for activity change and fatigue. Negative for appetite change, chills, diaphoresis, fever and unexpected weight change.  Respiratory: Negative.   Cardiovascular: Negative.   Musculoskeletal: Negative.    Psychiatric/Behavioral: Positive for agitation (just occasionally now with life stressors ), decreased concentration and sleep disturbance. Negative for behavioral problems, confusion, dysphoric mood, hallucinations, self-injury and suicidal ideas. The patient is nervous/anxious. The patient is not hyperactive.    GAD 7 : Generalized Anxiety Score 12/05/2020 11/04/2020 09/05/2020  Nervous, Anxious, on Edge _0 Control/stop worrying _1 Worry too much - different things _2 Trouble relaxing _3 Restless _4 Easily annoyed or irritable _5 Afraid - awful might happen _6 Total GAD 7 Score _7 Anxiety Difficulty Very difficult Extremely difficult Very difficult       Objective:    Physical Exam Vitals reviewed.  Constitutional:      General: She is not in acute distress.    Appearance: She is well-developed. She is not diaphoretic.     Interventions: She is not intubated. HENT:     Head: Normocephalic and atraumatic.     Right Ear: External ear normal.     Left Ear: External ear normal.     Nose: Nose normal.     Mouth/Throat:     Pharynx: No oropharyngeal exudate.  Eyes:     General: Lids are normal. No scleral icterus.       Right eye: No discharge.        Left eye: No discharge.     Conjunctiva/sclera: Conjunctivae normal.     Right eye: Right conjunctiva is not injected. No exudate or hemorrhage.    Left eye: Left conjunctiva is not injected. No exudate or hemorrhage.    Pupils: Pupils are equal, round, and reactive to light.  Neck:     Thyroid: No thyroid mass or thyromegaly.     Vascular: Normal carotid pulses. No carotid bruit, hepatojugular reflux or JVD.     Trachea: Trachea and phonation normal. No tracheal tenderness or tracheal deviation.     Meningeal: Brudzinski's sign and Kernig's sign absent.  Cardiovascular:     Rate and Rhythm: Normal rate and regular rhythm.     Pulses: Normal pulses.          Radial pulses are 2+ on the right side  and 2+ on the left side.       Dorsalis pedis pulses are 2+ on the right side and 2+ on the left side.       Posterior tibial pulses are 2+ on the right side and 2+ on the left side.     Heart sounds: Normal heart sounds, S1 normal and S2 normal. Heart sounds not distant. No murmur heard. No friction rub. No gallop.   Pulmonary:     Effort:  Pulmonary effort is normal. No tachypnea, bradypnea, accessory muscle usage or respiratory distress. She is not intubated.     Breath sounds: Normal breath sounds. No stridor. No wheezing or rales.  Chest:     Chest wall: No tenderness.  Breasts:     Right: No supraclavicular adenopathy.     Left: No supraclavicular adenopathy.    Abdominal:     General: Bowel sounds are normal. There is no distension or abdominal bruit.     Palpations: Abdomen is soft. There is no shifting dullness, fluid wave, hepatomegaly, splenomegaly, mass or pulsatile mass.     Tenderness: There is no abdominal tenderness. There is no guarding or rebound.     Hernia: No hernia is present.  Musculoskeletal:        General: No tenderness or deformity. Normal range of motion.     Cervical back: Full passive range of motion without pain, normal range of motion and neck supple. No edema, erythema or rigidity. No spinous process tenderness or muscular tenderness. Normal range of motion.  Lymphadenopathy:     Head:     Right side of head: No submental, submandibular, tonsillar, preauricular, posterior auricular or occipital adenopathy.     Left side of head: No submental, submandibular, tonsillar, preauricular, posterior auricular or occipital adenopathy.     Cervical: No cervical adenopathy.     Right cervical: No superficial, deep or posterior cervical adenopathy.    Left cervical: No superficial, deep or posterior cervical adenopathy.     Upper Body:     Right upper body: No supraclavicular or pectoral adenopathy.     Left upper body: No supraclavicular or pectoral adenopathy.   Skin:    General: Skin is warm and dry.     Coloration: Skin is not pale.     Findings: No abrasion, bruising, burn, ecchymosis, erythema, lesion, petechiae or rash.     Nails: There is no clubbing.  Neurological:     Mental Status: She is alert and oriented to person, place, and time.     GCS: GCS eye subscore is 4. GCS verbal subscore is 5. GCS motor subscore is 6.     Cranial Nerves: No cranial nerve deficit.     Sensory: No sensory deficit.     Motor: No tremor, atrophy, abnormal muscle tone or seizure activity.     Coordination: Coordination normal.     Gait: Gait normal.     Deep Tendon Reflexes: Reflexes are normal and symmetric. Reflexes normal. Babinski sign absent on the right side. Babinski sign absent on the left side.     Reflex Scores:      Tricep reflexes are 2+ on the right side and 2+ on the left side.      Bicep reflexes are 2+ on the right side and 2+ on the left side.      Brachioradialis reflexes are 2+ on the right side and 2+ on the left side.      Patellar reflexes are 2+ on the right side and 2+ on the left side.      Achilles reflexes are 2+ on the right side and 2+ on the left side. Psychiatric:        Speech: Speech normal.        Behavior: Behavior normal.        Thought Content: Thought content normal.        Judgment: Judgment normal.     BP 122/84   Pulse 76   Temp  98 F (36.7 C)   Ht 5' 9.02" (1.753 m)   Wt 219 lb 9.6 oz (99.6 kg)   LMP 11/12/2020   SpO2 96%   BMI 32.41 kg/m  Wt Readings from Last 3 Encounters:  12/05/20 219 lb 9.6 oz (99.6 kg)  11/04/20 232 lb 6.4 oz (105.4 kg)  09/26/20 229 lb 14.4 oz (104.3 kg)    Health Maintenance Due  Topic Date Due  . Hepatitis C Screening  Never done    There are no preventive care reminders to display for this patient.   Lab Results  Component Value Date   TSH 3.890 09/05/2020   Lab Results  Component Value Date   WBC 8.3 09/05/2020   HGB 13.3 09/05/2020   HCT 38.5 09/05/2020    MCV 93 09/05/2020   PLT 326 09/05/2020   Lab Results  Component Value Date   NA 139 09/05/2020   K 4.7 09/05/2020   CO2 20 09/05/2020   GLUCOSE 87 09/05/2020   BUN 14 09/05/2020   CREATININE 0.73 09/05/2020   BILITOT 0.8 09/05/2020   ALKPHOS 98 09/05/2020   AST 9 09/05/2020   ALT 11 09/05/2020   PROT 7.1 09/05/2020   ALBUMIN 4.7 09/05/2020   CALCIUM 9.9 09/05/2020   ANIONGAP 9 06/10/2020   EGFR 108 09/05/2020   No results found for: CHOL No results found for: HDL No results found for: LDLCALC No results found for: TRIG No results found for: CHOLHDL Lab Results  Component Value Date   HGBA1C 5.2 11/23/2017       Assessment & Plan:   Problem List Items Addressed This Visit      Other   Stress at home   Grief- associated with infidelity by her spouse.    Anxiety - Primary   Relevant Medications   ALPRAZolam (XANAX) 1 MG tablet   Depression, major, single episode, severe (HCC)   Relevant Medications   ALPRAZolam (XANAX) 1 MG tablet   Insomnia      1. Anxiety Continue Zoloft 100 mg once daily.  Continue Trazodone as ordered.  Xanax decreased to 1 mg po BID PRN, with goal to continue to decrease.  GAD scoring improved from 21 last visit to 16 today's visit.    Discussed known black box warning for anti depression/ anxiety medication. Need to report any behavioral changes right, if any homicidal or suicidal thoughts or ideas seek medical attention right away. Call 911.  2. Depression, major, single episode, severe ([place]) See # 1   3. Grief- associated with infidelity by her spouse.  See #1  4. Insomnia, unspecified type Continue Trazodone as ordered seems to be beneficial  to patient.   5. Stress at home She seems to be coping better with her loss of spouse, he left.  She has son at home. She is able to help him more. She will benefit from being out of work to focus on her life and mental health recommend her not returning to work until 01/15/21.  She has  psychiatry appointment with behavioral health,01/09/2021 and there are no sooner appointments. Her work is requiring her to be seen by psychiatry twice in June and she will go to the [facility] walk in for this and keep July psychiatry appointment.  July 5th return to work if able, needs FMLA forms by June 13 th.  [facility] Address:  [address] [place], [place] [address] Phone: [phone] Fax: [phone] Website: [email or web address] How To Access Our Services  Because our main goal is to meet the needs of our consumers, [facility] operates on a walk-in basis! To access services, there are just 3 easy steps: 1) Walk in any Monday, Wednesday or Friday between 8:00 am and 3:00 pm and complete our consumer paperwork 2) A Comprehensive Clinical Assessment (CCA) will be completed and appropriate service recommendations will be provided 3) Recommendations are sent to [facility] team members and the appropriate staff will call you within days. Advanced Access Open M - F, 8:00 am - 8:00 pm  Mental health crisis services for all age groups  Triage  Psychiatric Evaluations  Involuntary Commitments  PMP aware verified.  Meds ordered this encounter  Medications  . ALPRAZolam (XANAX) 1 MG tablet    Sig: Take 1 tablet (1 mg total) by mouth 2 (two) times daily.    Dispense:  60 tablet    Refill:  0    Return in about 1 month (around 01/04/2021), or if symptoms worsen or fail to improve, for at any time for any worsening symptoms, Go to Emergency room/ urgent care if worse. Marcille Buffy, FNP

## 2020-12-05 NOTE — Patient Instructions (Addendum)
Walk in to [facility] for visit.  Psychiatric/Counseling Resources Discussed As Follows:  If Emergency please seek Emergency Room Care Immediately or Call 911.   [facility] Address:  [address] [place], [place] [address] Phone: [phone] Website : [email or web address]   [facility] Address:  [address] [place], [place] [address] Phone: [phone] Fax: [phone] Website: [email or web address] How To Access Our Services Because our main goal is to meet the needs of our consumers, [facility] operates on a walk-in basis! To access services, there are just 3 easy steps: 1) Walk in any Monday, Wednesday or Friday between 8:00 am and 3:00 pm and complete our consumer paperwork 2) A Comprehensive Clinical Assessment (CCA) will be completed and appropriate service recommendations will be provided 3) Recommendations are sent to [facility] team members and the appropriate staff will call you within days. Advanced Access Open M - F, 8:00 am - 8:00 pm  Mental health crisis services for all age groups  Triage  Psychiatric Evaluations  Involuntary Commitments  [facility]  Address: [address] [place], [place] [address] Website : [email or web address] Walk in's accepted see web site or call for more information Phone : [phone] Also has [facility] [phone]    Psychology Today Find a therapist by searching online in your area or specialist by your diagnosis Website:  https://www.psychologytoday.com/us     Managing Anxiety, Adult After being diagnosed with an anxiety disorder, you may be relieved to know why you have felt or behaved a certain way. You may also feel overwhelmed about the treatment ahead and what it will mean for your life. With care and support, you can manage this condition and recover from it. How to manage lifestyle changes Managing stress and  anxiety Stress is your body's reaction to life changes and events, both good and bad. Most stress will last just a few hours, but stress can be ongoing and can lead to more than just stress. Although stress can play a major role in anxiety, it is not the same as anxiety. Stress is usually caused by something external, such as a deadline, test, or competition. Stress normally passes after the triggering event has ended.  Anxiety is caused by something internal, such as imagining a terrible outcome or worrying that something will go wrong that will devastate you. Anxiety often does not go away even after the triggering event is over, and it can become long-term (chronic) worry. It is important to understand the differences between stress and anxiety and to manage your stress effectively so that it does not lead to an anxious response. Talk with your health care provider or a counselor to learn more about reducing anxiety and stress. He or she may suggest tension reduction techniques, such as:  Music therapy. This can include creating or listening to music that you enjoy and that inspires you.  Mindfulness-based meditation. This involves being aware of your normal breaths while not trying to control your breathing. It can be done while sitting or walking.  Centering prayer. This involves focusing on a word, phrase, or sacred image that means something to you and brings you peace.  Deep breathing. To do this, expand your stomach and inhale slowly through your nose. Hold your breath for 3-5 seconds. Then exhale slowly, letting your stomach muscles relax.  Self-talk. This involves identifying thought patterns that lead to anxiety reactions and changing those patterns.  Muscle relaxation. This involves tensing muscles and then relaxing them.  Choose a tension reduction technique that suits your lifestyle and personality. These techniques take time and practice. Set aside 5-15 minutes a day to do them.  Therapists can offer counseling and training in these techniques. The training to help with anxiety may be covered by some insurance plans. Other things you can do to manage stress and anxiety include:  Keeping a stress/anxiety diary. This can help you learn what triggers your reaction and then learn ways to manage your response.  Thinking about how you react to certain situations. You may not be able to control everything, but you can control your response.  Making time for activities that help you relax and not feeling guilty about spending your time in this way.  Visual imagery and yoga can help you stay calm and relax.   Medicines Medicines can help ease symptoms. Medicines for anxiety include:  Anti-anxiety drugs.  Antidepressants. Medicines are often used as a primary treatment for anxiety disorder. Medicines will be prescribed by a health care provider. When used together, medicines, psychotherapy, and tension reduction techniques may be the most effective treatment. Relationships Relationships can play a big part in helping you recover. Try to spend more time connecting with trusted friends and family members. Consider going to couples counseling, taking family education classes, or going to family therapy. Therapy can help you and others better understand your condition. How to recognize changes in your anxiety Everyone responds differently to treatment for anxiety. Recovery from anxiety happens when symptoms decrease and stop interfering with your daily activities at home or work. This may mean that you will start to:  Have better concentration and focus. Worry will interfere less in your daily thinking.  Sleep better.  Be less irritable.  Have more energy.  Have improved memory. It is important to recognize when your condition is getting worse. Contact your health care provider if your symptoms interfere with home or work and you feel like your condition is not  improving. Follow these instructions at home: Activity  Exercise. Most adults should do the following: ? Exercise for at least 150 minutes each week. The exercise should increase your heart rate and make you sweat (moderate-intensity exercise). ? Strengthening exercises at least twice a week.  Get the right amount and quality of sleep. Most adults need 7-9 hours of sleep each night. Lifestyle  Eat a healthy diet that includes plenty of vegetables, fruits, whole grains, low-fat dairy products, and lean protein. Do not eat a lot of foods that are high in solid fats, added sugars, or salt.  Make choices that simplify your life.  Do not use any products that contain nicotine or tobacco, such as cigarettes, e-cigarettes, and chewing tobacco. If you need help quitting, ask your health care provider.  Avoid caffeine, alcohol, and certain over-the-counter cold medicines. These may make you feel worse. Ask your pharmacist which medicines to avoid.   General instructions  Take over-the-counter and prescription medicines only as told by your health care provider.  Keep all follow-up visits as told by your health care provider. This is important. Where to find support You can get help and support from these sources:  Self-help groups.  Online and [employer].  A trusted spiritual leader.  Couples counseling.  Family education classes.  Family therapy. Where to find more information You may find that joining a support group helps you deal with your anxiety. The following sources can help you locate counselors or support groups near you:  Mental Health America:  www.mentalhealthamerica.net  Anxiety and Depression Association of [place] (ADAA): ProgramCam.de  [employer] on Mental Illness (NAMI): www.nami.org Contact a health care provider if you:  Have a hard time staying focused or finishing daily tasks.  Spend many hours a day feeling worried about everyday  life.  Become exhausted by worry.  Start to have headaches, feel tense, or have nausea.  Urinate more than normal.  Have diarrhea. Get help right away if you have:  A racing heart and shortness of breath.  Thoughts of hurting yourself or others. If you ever feel like you may hurt yourself or others, or have thoughts about taking your own life, get help right away. You can go to your nearest emergency department or call:  Your local emergency services (911 in the [place]  A suicide crisis helpline, such as the National Suicide Prevention Lifeline at 912-492-8567. This is open 24 hours a day. Summary  Taking steps to learn and use tension reduction techniques can help calm you and help prevent triggering an anxiety reaction.  When used together, medicines, psychotherapy, and tension reduction techniques may be the most effective treatment.  Family, friends, and partners can play a big part in helping you recover from an anxiety disorder. This information is not intended to replace advice given to you by your health care provider. Make sure you discuss any questions you have with your health care provider. Document Revised: 11/22/2018 Document Reviewed: 11/22/2018 Elsevier Patient Education  2021 [employer].

## 2020-12-09 NOTE — Telephone Encounter (Signed)
PT called to advise that [facility] and the other placed she was referred to does not take her insurance nor are they taking any new patients atm. PT advise she needs a referral to a psychiatrist that does take her insurance and is taking new PTs asap as she needs to be twice in this month.

## 2020-12-09 NOTE — Telephone Encounter (Signed)
Spoke with patient. She is going to call her insurance to see who they cover and call back.

## 2020-12-10 NOTE — Telephone Encounter (Signed)
Spoke with pt. Informed the pt that her disability forms have been faxed. Pt states she has not heard from her insurance yet. Pt states [facility] would not take her insurance. I have tried to call them and the call didn't go through.

## 2020-12-10 NOTE — Telephone Encounter (Signed)
She could try [facility] or other clinic, I am unsure who could see her sooner if the walk in clinics will not, they usually do not deny care.

## 2020-12-11 NOTE — Telephone Encounter (Signed)
Called pt to suggest walk in. Pt will let us know if she was able to be seen.

## 2021-01-09 ENCOUNTER — Telehealth: Payer: Self-pay | Admitting: Psychiatry

## 2021-01-09 ENCOUNTER — Ambulatory Visit: Payer: No Typology Code available for payment source | Admitting: Adult Health

## 2021-01-15 ENCOUNTER — Telehealth: Payer: Self-pay

## 2021-01-15 NOTE — Telephone Encounter (Signed)
Received a call from [place] with [place]. She states that Allison Moss had submitted a claim for Short Term Disability and an extension to not being at work. Allison Moss called in asking if we have any updated notes on Allison Moss or documentation from her Psychiatrist. Allison Moss was informed that the last visit with our office on 12/05/20 and that was when we submitted the disability paperwork. Allison Moss has not had an appointment with our office and has an appointment scheduled for September. Allison Moss states that she was informed that Allison Moss sees Allison Moss from [employer] and she will reach out to their office to get more information for the extension.

## 2021-03-11 ENCOUNTER — Ambulatory Visit: Payer: No Typology Code available for payment source | Admitting: Adult Health

## 2021-10-18 ENCOUNTER — Emergency Department
Admission: EM | Admit: 2021-10-18 | Discharge: 2021-10-18 | Disposition: A | Discharge status: Home / Self Care | Payer: Self-pay | Attending: Student in an Organized Health Care Education/Training Program | Admitting: Student in an Organized Health Care Education/Training Program

## 2021-10-18 ENCOUNTER — Encounter: Payer: Self-pay | Admitting: Emergency Medicine

## 2021-10-18 ENCOUNTER — Other Ambulatory Visit: Payer: Self-pay

## 2021-10-18 ENCOUNTER — Emergency Department: Payer: Self-pay

## 2021-10-18 DIAGNOSIS — N390 Urinary tract infection, site not specified: Secondary | ICD-10-CM | POA: Insufficient documentation

## 2021-10-18 LAB — URINALYSIS, ROUTINE W REFLEX MICROSCOPIC
Bilirubin Urine: NEGATIVE
Glucose, UA: NEGATIVE mg/dL
Ketones, ur: NEGATIVE mg/dL
Nitrite: POSITIVE — AB
Protein, ur: 100 mg/dL — AB
Specific Gravity, Urine: 1.023 (ref 1.005–1.030)
WBC, UA: >50 WBC/hpf — ABNORMAL HIGH (ref 0–5)
pH: 5 (ref 5.0–8.0)

## 2021-10-18 LAB — BASIC METABOLIC PANEL
Anion gap: 8 (ref 5–15)
BUN: 14 mg/dL (ref 6–20)
CO2: 23 mmol/L (ref 22–32)
Calcium: 9.2 mg/dL (ref 8.9–10.3)
Chloride: 105 mmol/L (ref 98–111)
Creatinine, Ser: 0.7 mg/dL (ref 0.44–1.00)
GFR, Estimated: >60 mL/min (ref >60)
Glucose, Bld: 106 mg/dL — ABNORMAL HIGH (ref 70–99)
Potassium: 3.9 mmol/L (ref 3.5–5.1)
Sodium: 136 mmol/L (ref 135–145)

## 2021-10-18 LAB — CBC
HCT: 36.8 % (ref 36.0–46.0)
Hemoglobin: 11.9 g/dL — ABNORMAL LOW (ref 12.0–15.0)
MCH: 31.6 pg (ref 26.0–34.0)
MCHC: 32.3 g/dL (ref 30.0–36.0)
MCV: 97.6 fL (ref 80.0–100.0)
Platelets: 218 10*3/uL (ref 150–400)
RBC: 3.77 MIL/uL — ABNORMAL LOW (ref 3.87–5.11)
RDW: 14 % (ref 11.5–15.5)
WBC: 8.9 10*3/uL (ref 4.0–10.5)
nRBC: 0 % (ref 0.0–0.2)

## 2021-10-18 LAB — LACTIC ACID, PLASMA: Lactic Acid, Venous: 1.1 mmol/L (ref 0.5–1.9)

## 2021-10-18 LAB — POC URINE PREG, ED: Preg Test, Ur: NEGATIVE

## 2021-10-18 MED ORDER — PHENAZOPYRIDINE HCL 200 MG PO TABS: 200.0000 mg | ORAL_TABLET | Freq: Three times a day (TID) | ORAL | 0 refills | Status: DC | PRN

## 2021-10-18 MED ORDER — IBUPROFEN 600 MG PO TABS
600.0000 mg | ORAL_TABLET | Freq: Once | ORAL | Status: AC
Start: 2021-10-18 — End: 2021-10-18
  Administered 2021-10-18: 600 mg via ORAL
  Filled 2021-10-18: qty 1

## 2021-10-18 MED ORDER — SODIUM CHLORIDE 0.9 % IV SOLN
1.0000 g | Freq: Once | INTRAVENOUS | Status: AC
  Administered 2021-10-18: 1 g via INTRAVENOUS
  Filled 2021-10-18: qty 10

## 2021-10-18 MED ORDER — ONDANSETRON HCL 4 MG/2ML IJ SOLN
4.0000 mg | Freq: Once | INTRAMUSCULAR | Status: AC
  Administered 2021-10-18: 4 mg via INTRAVENOUS
  Filled 2021-10-18: qty 2

## 2021-10-18 MED ORDER — NAPROXEN 500 MG PO TABS: 500.0000 mg | ORAL_TABLET | Freq: Two times a day (BID) | ORAL | Status: DC

## 2021-10-18 MED ORDER — CEPHALEXIN 500 MG PO CAPS
500.0000 mg | ORAL_CAPSULE | Freq: Four times a day (QID) | ORAL | 0 refills | Status: AC
Start: 2021-10-18 — End: 2021-10-28

## 2021-10-18 MED ORDER — SODIUM CHLORIDE 0.9 % IV BOLUS
1000.0000 mL | Freq: Once | INTRAVENOUS | Status: AC
  Administered 2021-10-18: 1000 mL via INTRAVENOUS

## 2021-10-18 NOTE — ED Notes (Signed)
Pt to ED with partner, complains of R flank pain that started Thursday (3d ago) and has been intermittent and fluctuating since then. Felt better yesterday but then this morning around 0100 began having severe pain again, in R lower back and side, and had chills with profuse sweating. States has had fever but has not taken temp. Resting with blankets and lights dimmed. Rates pain as 7-9/10 as pain goes up and down.  ?

## 2021-10-18 NOTE — Discharge Instructions (Addendum)
Your CT scan shows you have passed the stone into your bladder.  Your lab results consistent with urinary tract infection.  Read and follow discharge care instruction; take medication as directed. ?

## 2021-10-18 NOTE — ED Provider Notes (Addendum)
? ?[facility] ?Provider Note ? ? ? Event Date/Time  ? First MD Initiated Contact with Patient 10/18/21 0919   ?  (approximate) ? ? ?History  ? ?Flank Pain ? ? ?HPI ? ?Allison Moss is a 41 y.o. female   presents to the ED with complaint of right sided flank pain that began Thursday night.  Patient states there is pressure in her pelvic area when she urinates but no hematuria and no history of kidney stones.  Patient has had cystitis in the past.  She reports some mild nausea but no vomiting.  She is unaware of any fever but states she has felt cold especially in the emergency department.  Patient has history of migraines, asthma, arthritis and calculus of kidney.  She rates her pain as 6/10. ? ?  ? ? ?Physical Exam  ? ?Triage Vital Signs: ?ED Triage Vitals  ?Enc Vitals Group  ?   BP 10/18/21 0842 124/74  ?   Pulse Rate 10/18/21 0842 97  ?   Resp 10/18/21 0842 18  ?   Temp 10/18/21 0842 98.8 ?F (37.1 ?C)  ?   Temp Source 10/18/21 0842 Oral  ?   SpO2 10/18/21 0842 98 %  ?   Weight 10/18/21 0852 200 lb (90.7 kg)  ?   Height 10/18/21 0850 5\' 9"  (1.753 m)  ?   Head Circumference --   ?   Peak Flow --   ?   Pain Score 10/18/21 0850 6  ?   Pain Loc --   ?   Pain Edu? --   ?   Excl. in [place]? --   ? ? ?Most recent vital signs: ?Vitals:  ? 10/18/21 1130 10/18/21 1158  ?BP: (!) 120/59   ?Pulse: 99   ?Resp: (!) 22   ?Temp:  100.3 ?F (37.9 ?C)  ?SpO2: 94%   ? ? ? ?General: Awake, no distress.  ?CV:  Good peripheral perfusion.  Heart regular rate and rhythm. ?Resp:  Normal effort.  Lungs are clear bilaterally. ?Abd:  No distention.  No CVA tenderness. ?Other:   ? ? ?ED Results / Procedures / Treatments  ? ?Labs ?(all labs ordered are listed, but only abnormal results are displayed) ?Labs Reviewed  ?URINALYSIS, ROUTINE W REFLEX MICROSCOPIC - Abnormal; Notable for the following components:  ?    Result Value  ? Color, Urine YELLOW (*)   ? APPearance HAZY (*)   ? Hgb urine dipstick SMALL (*)   ? Protein, ur  100 (*)   ? Nitrite POSITIVE (*)   ? Leukocytes,Ua LARGE (*)   ? WBC, UA >50 (*)   ? Bacteria, UA MANY (*)   ? All other components within normal limits  ?BASIC METABOLIC PANEL - Abnormal; Notable for the following components:  ? Glucose, Bld 106 (*)   ? All other components within normal limits  ?CBC - Abnormal; Notable for the following components:  ? RBC 3.77 (*)   ? Hemoglobin 11.9 (*)   ? All other components within normal limits  ?URINE CULTURE  ?LACTIC ACID, PLASMA  ?LACTIC ACID, PLASMA  ?POC URINE PREG, ED  ? ? ? ? ? ?PROCEDURES: ? ?Critical Care performed:  ? ?Procedures ? ? ?MEDICATIONS ORDERED IN ED: ?Medications  ?ibuprofen (ADVIL) tablet 600 mg (has no administration in time range)  ?cefTRIAXone (ROCEPHIN) 1 g in sodium chloride 0.9 % 100 mL IVPB (0 g Intravenous Stopped 10/18/21 1049)  ?ondansetron (ZOFRAN) injection 4 mg (4  mg Intravenous Given 10/18/21 1018)  ?sodium chloride 0.9 % bolus 1,000 mL (1,000 mLs Intravenous New Bag/Given 10/18/21 1056)  ? ? ? ?IMPRESSION / MDM / ASSESSMENT AND PLAN / ED COURSE  ?I reviewed the triage vital signs and the nursing notes. ? ? ?Differential diagnosis includes, but is not limited to, urolithiasis, cystitis, acute urinary tract infection. ? ?41 year old female presents to the ED with complaint of right sided flank pain that started Thursday night with pressure in her pelvis when she has to urinate.  Patient denies any previous history of kidney stones but has had cystitis before.  Is unaware of any fever at home but has had some nausea without vomiting.  At this time urinalysis shows nitrate positive with greater than 50,000 WBCs and many bacteria.  WBC was reassuring at 8.9 and metabolic panel unremarkable.  A urine culture was ordered.  Lactic acid was 1.1. ? ?----------------------------------------- ?12:02 PM on 10/18/2021 ?----------------------------------------- ?Patient care is being turned over to Mardee Postin, PA-C and awaiting results of her CT scan to  rule out a urolithiasis with obstruction.  Patient currently is getting Rocephin 1 g IV and will be discharged on antibiotics.  Currently she is tolerating p.o. fluids without any difficulty. ? ?  ? ?Discussed CT findings with patient showing kidney stone has traveled to her bladder.  Patient states she is feeling better.  Given discharge care instructions and advised take medication as directed.  Follow-up with PCP. ?FINAL CLINICAL IMPRESSION(S) / ED DIAGNOSES  ? ?Final diagnoses:  ?Urinary tract infection without hematuria, site unspecified  ? ? ? ?Rx / DC Orders  ? ?ED Discharge Orders   ? ? None  ? ?  ? ? ? ?Note:  This document was prepared using Dragon voice recognition software and may include unintentional dictation errors. ?  ?Johnn Hai, PA-C ?10/18/21 1205 ? ?  ?Merlyn Lot, MD ?10/18/21 1236 ? ?  ?Sable Feil, PA-C ?10/18/21 1323 ? ?  ?Merlyn Lot, MD ?10/18/21 1406 ? ?

## 2021-10-18 NOTE — ED Notes (Signed)
Pt asked for juice, provided to pt with approval from provider.  ?

## 2021-10-18 NOTE — ED Triage Notes (Signed)
Pt via POV from home. Pt c/o R sided flank pain. Pt states it started Thursday night. Pt states she has pressure in her pelvic area when she has to urinate. No blood in urine noted. Pt took Tylenol at 0515 this AM. Pt is A&OX4 and NAD ?

## 2021-10-21 LAB — URINE CULTURE: Culture: >=100000 — AB

## 2022-07-14 DIAGNOSIS — B009 Herpesviral infection, unspecified: Secondary | ICD-10-CM | POA: Diagnosis not present

## 2022-10-16 ENCOUNTER — Encounter: Payer: Self-pay | Admitting: Emergency Medicine

## 2022-10-16 ENCOUNTER — Ambulatory Visit
Admission: EM | Admit: 2022-10-16 | Discharge: 2022-10-16 | Disposition: A | Discharge status: Home / Self Care | Payer: Medicaid Other | Attending: Physician Assistant | Admitting: Physician Assistant

## 2022-10-16 DIAGNOSIS — J019 Acute sinusitis, unspecified: Secondary | ICD-10-CM

## 2022-10-16 DIAGNOSIS — J45901 Unspecified asthma with (acute) exacerbation: Secondary | ICD-10-CM

## 2022-10-16 MED ORDER — PROMETHAZINE-DM 6.25-15 MG/5ML PO SYRP: 5.0000 mL | ORAL_SOLUTION | Freq: Four times a day (QID) | ORAL | 0 refills | Status: AC | PRN

## 2022-10-16 MED ORDER — AMOXICILLIN-POT CLAVULANATE 875-125 MG PO TABS: 1.0000 | ORAL_TABLET | Freq: Two times a day (BID) | ORAL | 0 refills | Status: AC

## 2022-10-16 MED ORDER — PREDNISONE 20 MG PO TABS: 40.0000 mg | ORAL_TABLET | Freq: Every day | ORAL | 0 refills | Status: AC

## 2022-10-16 MED ORDER — ALBUTEROL SULFATE HFA 108 (90 BASE) MCG/ACT IN AERS: 1.0000 | INHALATION_SPRAY | Freq: Four times a day (QID) | RESPIRATORY_TRACT | 0 refills | Status: AC | PRN

## 2022-10-16 NOTE — ED Triage Notes (Signed)
Patient c/o sinus swelling and pressure, and bilateral ear pain that started 2 days.  Patient denies fevers.  Patient needs a work note.

## 2022-10-16 NOTE — ED Provider Notes (Signed)
[facility]    CSN: [id number] Arrival date & time: 10/16/22  4098      History   Chief Complaint Chief Complaint  Patient presents with   Otalgia   Sinus Problem    HPI Allison Moss is a 42 y.o. female with history of asthma, allergies and arthritis.  She presents today for approximately 2 to 3-week history of nasal congestion and drainage with sinus pressure.  She says over the past 2 days she started to have bilateral ear pain which is worse on the right side, sinus pain worse on the left side, increased nasal drainage which is now with yellowish discoloration.  Denies fever or fatigue.  Denies chest pain or breathing difficulty or wheezing.  She says she was previously on asthma inhalers but has not needed them in years.  She reports that she is recently got back into smoking.  Patient has been taking over-the-counter medicine without relief.  She also requested work note for today.  No other complaints.  HPI  Past Medical History:  Diagnosis Date   Arthritis    Rheumatoid and Psoriatic   Asthma     Patient Active Problem List   Diagnosis Date Noted   Tobacco use disorder, continuous 11/04/2020   Insomnia 11/04/2020   No-show for appointment 09/23/2020   Depression, major, single episode, severe 09/13/2020   Stress at home 09/05/2020   Grief- associated with infidelity by her spouse.  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      Home Medications    Prior to Admission medications   Medication Sig Start Date End Date Taking? Authorizing Provider  albuterol  (VENTOLIN HFA) 108 (90 Base) MCG/ACT inhaler Inhale 1-2 puffs into the lungs every 6 (six) hours as needed for wheezing or shortness of breath. 10/16/22  Yes Eusebio Friendly B, PA-C  amoxicillin-clavulanate (AUGMENTIN) 875-125 MG tablet Take 1 tablet by mouth every 12 (twelve) hours for 7 days. 10/16/22 10/23/22 Yes Shirlee Latch, PA-C  predniSONE (DELTASONE) 20 MG tablet Take 2 tablets (40 mg total) by mouth daily for 5 days. 10/16/22 10/21/22 Yes Shirlee Latch, PA-C  promethazine-dextromethorphan (PROMETHAZINE-DM) 6.25-15 MG/5ML syrup Take 5 mLs by mouth 4 (four) times daily as needed. 10/16/22  Yes Shirlee Latch, PA-C    Family History Family History  Problem Relation Age of Onset   Hypothyroidism Mother    Lung cancer Maternal Grandmother    Hypertension Maternal Grandfather    Arrhythmia Maternal Grandfather     Social History Social History   Tobacco Use   Smoking status: Some Days    Packs/day: 1    Types: Cigarettes   Smokeless tobacco: Never   Tobacco comments:    cuts back to 2 cigs daily   Vaping Use   Vaping Use: Never used  Substance Use Topics   Alcohol use: No    Alcohol/week: 0.0 standard drinks of alcohol    Comment: rare   Drug use: No     Allergies   Acetaminophen-codeine,  Escitalopram, Escitalopram oxalate, Hydrocodone-acetaminophen, Hydrocodone-acetaminophen, Other, Propoxyphene, and Tramadol   Review of Systems Review of Systems  Constitutional:  Negative for chills, diaphoresis, fatigue and fever.  HENT:  Positive for congestion, ear pain, rhinorrhea and sinus pressure. Negative for sinus pain and sore throat.   Respiratory:  Positive for cough. Negative for shortness of breath.   Cardiovascular:  Negative for chest pain.  Gastrointestinal:  Negative for abdominal pain, nausea and vomiting.  Musculoskeletal:  Negative for arthralgias and myalgias.  Skin:  Negative for rash.  Neurological:  Negative for weakness and headaches.  Hematological:   Negative for adenopathy.     Physical Exam Triage Vital Signs ED Triage Vitals  Enc Vitals Group     BP 10/16/22 0900 (!) 161/96     Pulse Rate 10/16/22 0900 74     Resp 10/16/22 0900 14     Temp 10/16/22 0900 97.9 F (36.6 C)     Temp Source 10/16/22 0900 Oral     SpO2 10/16/22 0900 97 %     Weight 10/16/22 0855 199 lb 15.3 oz (90.7 kg)     Height 10/16/22 0855  (1.753 m)     Head Circumference --      Peak Flow --      Pain Score 10/16/22 0855 5     Pain Loc --      Pain Edu? --      Excl. in [place]? --    No data found.  Updated Vital Signs BP (!) 161/96 (BP Location: Right Arm)   Pulse 74   Temp 97.9 F (36.6 C) (Oral)   Resp 14   Ht  (1.753 m)   Wt 199 lb 15.3 oz (90.7 kg)   LMP 10/06/2022 (Approximate)   SpO2 97%   BMI 29.53 kg/m   Physical Exam Vitals and nursing note reviewed.  Constitutional:      General: She is not in acute distress.    Appearance: Normal appearance. She is not ill-appearing or toxic-appearing.  HENT:     Head: Normocephalic and atraumatic.     Right Ear: Ear canal and external ear normal. A middle ear effusion is present.     Left Ear: Tympanic membrane, ear canal and external ear normal.     Nose: Congestion present.     Mouth/Throat:     Mouth: Mucous membranes are moist.     Pharynx: Oropharynx is clear.  Eyes:     General: No scleral icterus.       Right eye: No discharge.        Left eye: No discharge.     Conjunctiva/sclera: Conjunctivae normal.  Cardiovascular:     Rate and Rhythm: Normal rate and regular rhythm.     Heart sounds: Normal heart sounds.  Pulmonary:     Effort: Pulmonary effort is normal. No respiratory distress.     Breath sounds: Wheezing present.  Musculoskeletal:     Cervical back: Neck supple.  Skin:    General: Skin is dry.  Neurological:     General: No focal deficit present.     Mental Status: She is alert. Mental status is at baseline.     Motor: No weakness.     Gait: Gait normal.   Psychiatric:        Mood and Affect: Mood normal.        Behavior: Behavior normal.        Thought Content: Thought content normal.  UC Treatments / Results  Labs (all labs ordered are listed, but only abnormal results are displayed) Labs Reviewed - No data to display  EKG   Radiology No results found.  Procedures Procedures (including critical care time)  Medications Ordered in UC Medications - No data to display  Initial Impression / Assessment and Plan / UC Course  I have reviewed the triage vital signs and the nursing notes.  Pertinent labs & imaging results that were available during my care of the patient were reviewed by me and considered in my medical decision making (see chart for details).   42 year old female with history of asthma, allergies and arthritis presents for 2 to 3-week history of nasal congestion and drainage with sinus pressure.  Over the past 2 days she has developed sinus pain and bilateral ear pain.  No fever or breathing trouble.  She is afebrile and overall well-appearing.  No acute distress.  On exam she has nasal congestion, clear effusion of right TM.  Throat is clear with moderate amount of postnasal drainage.  Diffuse wheezing throughout all lung fields.  Suspect secondary acute bacterial sinusitis and asthma exacerbation.  Will treat this time with Augmentin, albuterol and prednisone.  Also prescribed Promethazine DM.  Encouraged increasing rest and fluids.  Reviewed returning if fever, worsening breathing, increased fatigue, etc.  Work note given.   Final Clinical Impressions(s) / UC Diagnoses   Final diagnoses:  Acute sinusitis, recurrence not specified, unspecified location  Exacerbation of asthma, unspecified asthma severity, unspecified whether persistent   Discharge Instructions   None    ED Prescriptions     Medication Sig Dispense Auth. Provider   amoxicillin-clavulanate (AUGMENTIN) 875-125 MG tablet Take 1 tablet by  mouth every 12 (twelve) hours for 7 days. 14 tablet Eusebio Friendly B, PA-C   albuterol (VENTOLIN HFA) 108 (90 Base) MCG/ACT inhaler Inhale 1-2 puffs into the lungs every 6 (six) hours as needed for wheezing or shortness of breath. 1 g Eusebio Friendly B, PA-C   predniSONE (DELTASONE) 20 MG tablet Take 2 tablets (40 mg total) by mouth daily for 5 days. 10 tablet Shirlee Latch, PA-C   promethazine-dextromethorphan (PROMETHAZINE-DM) 6.25-15 MG/5ML syrup Take 5 mLs by mouth 4 (four) times daily as needed. 118 mL Shirlee Latch, PA-C      PDMP not reviewed this encounter.   Shirlee Latch, PA-C 10/16/22 [phone]

## 2023-01-05 ENCOUNTER — Ambulatory Visit
Admission: EM | Admit: 2023-01-05 | Discharge: 2023-01-05 | Disposition: A | Discharge status: Home / Self Care | Payer: Medicaid Other | Attending: Nurse Practitioner | Admitting: Nurse Practitioner

## 2023-01-05 DIAGNOSIS — T7840XA Allergy, unspecified, initial encounter: Secondary | ICD-10-CM

## 2023-01-05 MED ORDER — METHYLPREDNISOLONE ACETATE 80 MG/ML IJ SUSP
60.0000 mg | Freq: Once | INTRAMUSCULAR | Status: AC
  Administered 2023-01-05: 60 mg via INTRAMUSCULAR

## 2023-01-05 NOTE — Discharge Instructions (Signed)
You are given a steroid injection in the clinic.  You may continue over-the-counter Benadryl as needed.  Please follow-up with your PCP in 2 to 3 days for recheck.  Please go to the ER if you develop any worsening symptoms.  I hope you feel better soon!

## 2023-01-05 NOTE — ED Triage Notes (Addendum)
Pt presents for possible allergic reaction to salmon, pt states she had a migraine on Sunday and took some of her Imitrex, foggy yesterday, broke out in hives yesterday. Pt states she woke up this morning with eyes swollen shut, face feels red, itchy

## 2023-01-05 NOTE — ED Provider Notes (Signed)
[facility]    CSN: [id number] Arrival date & time: 01/05/23  0909      History   Chief Complaint Chief Complaint  Patient presents with   Rash    HPI Allison Moss is a 42 y.o. female presents for evaluation of an allergic reaction.  Patient states she woke this morning with her eyes nearly swollen shut and her face "beet red" and very itchy.  Just states she had hives on her abdomen and arms.  She took 2 Benadryl and reports the majority of symptoms have improved but she still feels "itchy" all over.  Denies any tongue/lip/throat swelling.  No difficulty breathing or swallowing.  She did have salmon patties last night and was informed that her mother used a different type of salmon so she is not sure if the reaction is to that or to Imitrex which she took on Sunday for her migraine.  She is an active smoker but no asthma history.  No other concerns at this time.   Rash   Past Medical History:  Diagnosis Date   Arthritis    Rheumatoid and Psoriatic   Asthma     Patient Active Problem List   Diagnosis Date Noted   Tobacco use disorder, continuous 11/04/2020   Insomnia 11/04/2020   No-show for appointment 09/23/2020   Depression, major, single episode, severe (HCC) 09/13/2020   Stress at home 09/05/2020   Grief- associated with infidelity by her spouse.  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      Home Medications    Prior to Admission medications   Medication Sig Start Date End Date Taking? Authorizing Provider  albuterol (VENTOLIN HFA) 108 (90 Base)  MCG/ACT inhaler Inhale 1-2 puffs into the lungs every 6 (six) hours as needed for wheezing or shortness of breath. 10/16/22   Shirlee Latch, PA-C  promethazine-dextromethorphan (PROMETHAZINE-DM) 6.25-15 MG/5ML syrup Take 5 mLs by mouth 4 (four) times daily as needed. 10/16/22   Shirlee Latch, PA-C    Family History Family History  Problem Relation Age of Onset   Hypothyroidism Mother    Lung cancer Maternal Grandmother    Hypertension Maternal Grandfather    Arrhythmia Maternal Grandfather     Social History Social History   Tobacco Use   Smoking status: Some Days    Packs/day: 1    Types: Cigarettes   Smokeless tobacco: Never   Tobacco comments:    cuts back to 2 cigs daily   Vaping Use   Vaping Use: Never used  Substance Use Topics   Alcohol use: No    Alcohol/week: 0.0 standard drinks of alcohol    Comment: rare   Drug use: No     Allergies   Acetaminophen-codeine, Escitalopram, Escitalopram oxalate, Hydrocodone-acetaminophen, Hydrocodone-acetaminophen, Other, Propoxyphene, and Tramadol   Review of Systems Review of Systems  Skin:        Itchy all over     Physical Exam Triage Vital Signs ED Triage Vitals  Enc Vitals Group  BP 01/05/23 0936 134/83     Pulse Rate 01/05/23 0936 73     Resp --      Temp 01/05/23 0936 98.3 F (36.8 C)     Temp Source 01/05/23 0936 Oral     SpO2 01/05/23 0936 97 %     Weight --      Height --      Head Circumference --      Peak Flow --      Pain Score 01/05/23 0933 0     Pain Loc --      Pain Edu? --      Excl. in [place]? --    No data found.  Updated Vital Signs BP 134/83 (BP Location: Right Arm)   Pulse 73   Temp 98.3 F (36.8 C) (Oral)   LMP 12/19/2022 (Approximate)   SpO2 97%   Visual Acuity Right Eye Distance:   Left Eye Distance:   Bilateral Distance:    Right Eye Near:   Left Eye Near:    Bilateral Near:     Physical Exam Vitals and nursing note reviewed.  Constitutional:      Appearance:  Normal appearance.  HENT:     Head: Normocephalic and atraumatic.     Mouth/Throat:     Lips: Pink.     Mouth: Mucous membranes are moist. No angioedema.     Pharynx: Oropharynx is clear. Uvula midline. No pharyngeal swelling or uvula swelling.  Eyes:     Pupils: Pupils are equal, round, and reactive to light.  Cardiovascular:     Rate and Rhythm: Normal rate and regular rhythm.     Heart sounds: Normal heart sounds.  Pulmonary:     Effort: Pulmonary effort is normal. No respiratory distress.     Breath sounds: Normal breath sounds. No stridor. No wheezing, rhonchi or rales.  Skin:    General: Skin is warm and dry.  Neurological:     General: No focal deficit present.     Mental Status: She is alert and oriented to person, place, and time.  Psychiatric:        Mood and Affect: Mood normal.        Behavior: Behavior normal.      UC Treatments / Results  Labs (all labs ordered are listed, but only abnormal results are displayed) Labs Reviewed - No data to display  EKG   Radiology No results found.  Procedures Procedures (including critical care time)  Medications Ordered in UC Medications  methylPREDNISolone acetate (DEPO-MEDROL) injection 60 mg (60 mg Intramuscular Given 01/05/23 0947)    Initial Impression / Assessment and Plan / UC Course  I have reviewed the triage vital signs and the nursing notes.  Pertinent labs & imaging results that were available during my care of the patient were reviewed by me and considered in my medical decision making (see chart for details).     Reviewed exam and symptoms with patient.  No red flags.  She is well-appearing and in no acute distress.  No respiratory concern.  Patient given Depo-Medrol IM in clinic.  Monitored for 30 minutes after injection with no reaction noted and tolerated well.  She reports improvement in symptoms.  She may continue over-the-counter Benadryl as needed.  PCP follow-up in 2 days for recheck.  Strict ER  precautions reviewed and patient verbalized understanding Final Clinical Impressions(s) / UC Diagnoses   Final diagnoses:  Allergic reaction, initial encounter     Discharge Instructions  You are given a steroid injection in the clinic.  You may continue over-the-counter Benadryl as needed.  Please follow-up with your PCP in 2 to 3 days for recheck.  Please go to the ER if you develop any worsening symptoms.  I hope you feel better soon!     ED Prescriptions   None    PDMP not reviewed this encounter.   Radford Pax, NP 01/05/23 1016

## 2023-05-31 DIAGNOSIS — H16403 Unspecified corneal neovascularization, bilateral: Secondary | ICD-10-CM | POA: Diagnosis not present

## 2023-05-31 DIAGNOSIS — H16001 Unspecified corneal ulcer, right eye: Secondary | ICD-10-CM | POA: Diagnosis not present

## 2023-05-31 DIAGNOSIS — H2513 Age-related nuclear cataract, bilateral: Secondary | ICD-10-CM | POA: Diagnosis not present

## 2023-06-01 DIAGNOSIS — H16001 Unspecified corneal ulcer, right eye: Secondary | ICD-10-CM | POA: Diagnosis not present

## 2023-06-01 DIAGNOSIS — H2513 Age-related nuclear cataract, bilateral: Secondary | ICD-10-CM | POA: Diagnosis not present

## 2023-06-01 DIAGNOSIS — H16403 Unspecified corneal neovascularization, bilateral: Secondary | ICD-10-CM | POA: Diagnosis not present

## 2023-06-02 DIAGNOSIS — H2513 Age-related nuclear cataract, bilateral: Secondary | ICD-10-CM | POA: Diagnosis not present

## 2023-06-02 DIAGNOSIS — H16001 Unspecified corneal ulcer, right eye: Secondary | ICD-10-CM | POA: Diagnosis not present

## 2023-06-02 DIAGNOSIS — H16403 Unspecified corneal neovascularization, bilateral: Secondary | ICD-10-CM | POA: Diagnosis not present

## 2023-06-10 DIAGNOSIS — H16001 Unspecified corneal ulcer, right eye: Secondary | ICD-10-CM | POA: Diagnosis not present

## 2023-06-10 DIAGNOSIS — H2513 Age-related nuclear cataract, bilateral: Secondary | ICD-10-CM | POA: Diagnosis not present

## 2023-06-10 DIAGNOSIS — H16101 Unspecified superficial keratitis, right eye: Secondary | ICD-10-CM | POA: Diagnosis not present

## 2023-06-10 DIAGNOSIS — T1501XA Foreign body in cornea, right eye, initial encounter: Secondary | ICD-10-CM | POA: Diagnosis not present

## 2023-06-23 DIAGNOSIS — H16001 Unspecified corneal ulcer, right eye: Secondary | ICD-10-CM | POA: Diagnosis not present

## 2023-06-23 DIAGNOSIS — H2513 Age-related nuclear cataract, bilateral: Secondary | ICD-10-CM | POA: Diagnosis not present

## 2023-06-23 DIAGNOSIS — H16101 Unspecified superficial keratitis, right eye: Secondary | ICD-10-CM | POA: Diagnosis not present

## 2023-06-23 DIAGNOSIS — H16403 Unspecified corneal neovascularization, bilateral: Secondary | ICD-10-CM | POA: Diagnosis not present

## 2023-10-18 DIAGNOSIS — M13862 Other specified arthritis, left knee: Secondary | ICD-10-CM | POA: Diagnosis not present

## 2024-01-18 ENCOUNTER — Ambulatory Visit
Admission: EM | Admit: 2024-01-18 | Discharge: 2024-01-18 | Disposition: A | Discharge status: Home / Self Care | Attending: Emergency Medicine | Admitting: Emergency Medicine

## 2024-01-18 DIAGNOSIS — F17200 Nicotine dependence, unspecified, uncomplicated: Secondary | ICD-10-CM | POA: Diagnosis not present

## 2024-01-18 DIAGNOSIS — H9201 Otalgia, right ear: Secondary | ICD-10-CM | POA: Diagnosis not present

## 2024-01-18 MED ORDER — FLUTICASONE PROPIONATE 50 MCG/ACT NA SUSP: 1.0000 | Freq: Every day | NASAL | 0 refills | Status: AC

## 2024-01-18 NOTE — Discharge Instructions (Addendum)
 Your ears are not infected, avoid using Q-tips or Bobby pins, use Flonase  nasal spray as directed.  Stop smoking.  Follow-up with ENT if symptoms persist-referral to [facility] given.

## 2024-01-18 NOTE — ED Triage Notes (Signed)
 Pt c/o R ear pain that started this AM. Has tried OTC meds w/o relief.

## 2024-01-18 NOTE — ED Provider Notes (Signed)
 [facility]    CSN: [id number] Arrival date & time: 01/18/24  1452      History   Chief Complaint Chief Complaint  Patient presents with   Otalgia    HPI Allison Moss is a 43 y.o. female.   43 year old female, Allison Moss, presents to urgent care for evaluation of right ear pain that started this morning.  Patient states she has tried over-the-counter meds without relief. Pt endorses smoking, has dentures.  The history is provided by the patient. No language interpreter was used.    Past Medical History:  Diagnosis Date   Arthritis    Rheumatoid and Psoriatic   Asthma     Patient Active Problem List   Diagnosis Date Noted   Right ear pain 01/18/2024   Smoker 01/18/2024   Tobacco use disorder, continuous 11/04/2020   Insomnia 11/04/2020   No-show for appointment 09/23/2020   Depression, major, single episode, severe (HCC) 09/13/2020   Stress at home 09/05/2020   Grief- associated with infidelity by her spouse.  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      Home Medications    Prior to Admission medications   Medication Sig Start Date End Date Taking? Authorizing Provider  fluticasone  (FLONASE ) 50 MCG/ACT nasal spray Place 1 spray into both nostrils daily. 01/18/24  Yes Phyliss Hulick, Rilla, NP  albuterol  (VENTOLIN  HFA) 108 (90 Base) MCG/ACT inhaler Inhale 1-2 puffs into the lungs every 6 (six) hours as needed for wheezing or shortness of breath. 10/16/22   Arvis Huxley B, PA-C  promethazine -dextromethorphan (PROMETHAZINE -DM) 6.25-15 MG/5ML syrup  Take 5 mLs by mouth 4 (four) times daily as needed. 10/16/22   Arvis Huxley NOVAK, PA-C    Family History Family History  Problem Relation Age of Onset   Hypothyroidism Mother    Lung cancer Maternal Grandmother    Hypertension Maternal Grandfather    Arrhythmia Maternal Grandfather     Social History Social History   Tobacco Use   Smoking status: Some Days    Current packs/day: 1.00    Types: Cigarettes   Smokeless tobacco: Never   Tobacco comments:    cuts back to 2 cigs daily   Vaping Use   Vaping status: Never Used  Substance Use Topics   Alcohol use: No    Alcohol/week: 0.0 standard drinks of alcohol    Comment: rare   Drug use: No     Allergies   Acetaminophen -codeine, Escitalopram, Escitalopram oxalate, Hydrocodone -acetaminophen , Hydrocodone -acetaminophen , Other, Propoxyphene, and Tramadol   Review of Systems Review of Systems  Constitutional:  Negative for fever.  HENT:  Positive for ear pain. Negative for ear discharge and facial swelling.   All other systems reviewed and are negative.    Physical Exam Triage Vital Signs ED Triage Vitals  Encounter Vitals Group     BP --      Girls Systolic BP Percentile --      Girls Diastolic BP Percentile --      Boys Systolic BP Percentile --  Boys Diastolic BP Percentile --      Pulse --      Resp 01/18/24 1519 16     Temp --      Temp Source 01/18/24 1519 Oral     SpO2 --      Weight --      Height --      Head Circumference --      Peak Flow --      Pain Score 01/18/24 1522 8     Pain Loc --      Pain Education --      Exclude from Growth Chart --    No data found.  Updated Vital Signs BP (!) 158/84 (BP Location: Right Arm)   Pulse 70   Temp 98.3 F (36.8 C) (Oral)   Resp 16   LMP 01/17/2024 (Exact Date)   SpO2 98%   Visual Acuity Right Eye Distance:   Left Eye Distance:   Bilateral Distance:    Right Eye Near:   Left Eye Near:    Bilateral Near:     Physical Exam Vitals and  nursing note reviewed.  Constitutional:      General: She is not in acute distress.    Appearance: She is well-developed and well-groomed.  HENT:     Head: Normocephalic.     Right Ear: Tympanic membrane is retracted.     Left Ear: Tympanic membrane is retracted.     Nose: Congestion present.     Mouth/Throat:     Lips: Pink.     Mouth: Mucous membranes are moist.     Pharynx: Oropharynx is clear. Uvula midline.  Eyes:     General: Lids are normal.     Conjunctiva/sclera: Conjunctivae normal.     Pupils: Pupils are equal, round, and reactive to light.  Neck:     Trachea: No tracheal deviation.  Cardiovascular:     Rate and Rhythm: Normal rate and regular rhythm.     Heart sounds: Normal heart sounds. No murmur heard. Pulmonary:     Effort: Pulmonary effort is normal.     Breath sounds: Normal breath sounds and air entry.  Abdominal:     General: Bowel sounds are normal.     Palpations: Abdomen is soft.     Tenderness: There is no abdominal tenderness.  Musculoskeletal:        General: Normal range of motion.     Cervical back: Normal range of motion.  Lymphadenopathy:     Cervical: No cervical adenopathy.  Skin:    General: Skin is warm and dry.     Findings: No rash.  Neurological:     General: No focal deficit present.     Mental Status: She is alert and oriented to person, place, and time.     GCS: GCS eye subscore is 4. GCS verbal subscore is 5. GCS motor subscore is 6.  Psychiatric:        Speech: Speech normal.        Behavior: Behavior normal. Behavior is cooperative.      UC Treatments / Results  Labs (all labs ordered are listed, but only abnormal results are displayed) Labs Reviewed - No data to display  EKG   Radiology No results found.  Procedures Procedures (including critical care time)  Medications Ordered in UC Medications - No data to display  Initial Impression / Assessment and Plan / UC Course  I have reviewed the triage vital signs  and the  nursing notes.  Pertinent labs & imaging results that were available during my care of the patient were reviewed by me and considered in my medical decision making (see chart for details).    Discussed exam findings and plan of care with patient, referral to [facility] if symptoms persist -call for appointment , strict go to ER precautions given.   Patient verbalized understanding to this provider.  Ddx: Right otalgia, allergies, viral illness, TMJ, smoker Final Clinical Impressions(s) / UC Diagnoses   Final diagnoses:  Right ear pain  Smoker     Discharge Instructions      Your ears are not infected, avoid using Q-tips or Bobby pins, use Flonase  nasal spray as directed.  Stop smoking.  Follow-up with ENT if symptoms persist-referral to [facility] given.      ED Prescriptions     Medication Sig Dispense Auth. Provider   fluticasone  (FLONASE ) 50 MCG/ACT nasal spray Place 1 spray into both nostrils daily. 15.8 mL Agness Sibrian, NP      PDMP not reviewed this encounter.   Aminta Loose, NP 01/18/24 1742

## 2024-09-07 ENCOUNTER — Ambulatory Visit: Admitting: Physician Assistant
